# Patient Record
Sex: Female | Born: 1965 | Race: Black or African American | Hispanic: No | Smoking: Former smoker
Health system: Southern US, Community
[De-identification: ages and names within clinical notes are randomized; demographics above are authoritative.]

## PROBLEM LIST (undated history)

## (undated) DIAGNOSIS — L659 Nonscarring hair loss, unspecified: Secondary | ICD-10-CM

## (undated) DIAGNOSIS — F419 Anxiety disorder, unspecified: Secondary | ICD-10-CM

## (undated) DIAGNOSIS — R7303 Prediabetes: Secondary | ICD-10-CM

## (undated) DIAGNOSIS — G4730 Sleep apnea, unspecified: Secondary | ICD-10-CM

## (undated) DIAGNOSIS — I10 Essential (primary) hypertension: Secondary | ICD-10-CM

## (undated) DIAGNOSIS — K219 Gastro-esophageal reflux disease without esophagitis: Secondary | ICD-10-CM

## (undated) DIAGNOSIS — M7120 Synovial cyst of popliteal space [Baker], unspecified knee: Secondary | ICD-10-CM

## (undated) HISTORY — PX: KNEE SURGERY: SHX244

## (undated) HISTORY — PX: HEMORRHOID SURGERY: SHX153

## (undated) HISTORY — PX: ABDOMINAL HYSTERECTOMY: SHX81

## (undated) HISTORY — PX: JOINT REPLACEMENT: SHX530

## (undated) HISTORY — PX: APPENDECTOMY: SHX54

---

## 2002-06-20 ENCOUNTER — Emergency Department (HOSPITAL_COMMUNITY): Admission: EM | Admit: 2002-06-20 | Discharge: 2002-06-20 | Payer: Self-pay | Admitting: Emergency Medicine

## 2002-08-16 ENCOUNTER — Emergency Department (HOSPITAL_COMMUNITY): Admission: EM | Admit: 2002-08-16 | Discharge: 2002-08-17 | Payer: Self-pay | Admitting: Emergency Medicine

## 2003-03-04 ENCOUNTER — Emergency Department (HOSPITAL_COMMUNITY): Admission: EM | Admit: 2003-03-04 | Discharge: 2003-03-04 | Payer: Self-pay | Admitting: *Deleted

## 2003-07-30 ENCOUNTER — Emergency Department (HOSPITAL_COMMUNITY): Admission: EM | Admit: 2003-07-30 | Discharge: 2003-07-30 | Payer: Self-pay | Admitting: Family Medicine

## 2005-02-17 ENCOUNTER — Inpatient Hospital Stay (HOSPITAL_COMMUNITY): Admission: EM | Admit: 2005-02-17 | Discharge: 2005-02-19 | Payer: Self-pay | Admitting: Family Medicine

## 2005-09-22 ENCOUNTER — Other Ambulatory Visit: Admission: RE | Admit: 2005-09-22 | Discharge: 2005-09-22 | Payer: Self-pay | Admitting: Obstetrics and Gynecology

## 2005-09-29 ENCOUNTER — Encounter: Admission: RE | Admit: 2005-09-29 | Discharge: 2005-09-29 | Payer: Self-pay | Admitting: Obstetrics and Gynecology

## 2006-03-26 ENCOUNTER — Ambulatory Visit (HOSPITAL_COMMUNITY): Admission: RE | Admit: 2006-03-26 | Discharge: 2006-03-26 | Payer: Self-pay | Admitting: *Deleted

## 2006-03-30 ENCOUNTER — Encounter: Admission: RE | Admit: 2006-03-30 | Discharge: 2006-06-28 | Payer: Self-pay | Admitting: *Deleted

## 2006-04-10 ENCOUNTER — Ambulatory Visit (HOSPITAL_COMMUNITY): Admission: RE | Admit: 2006-04-10 | Discharge: 2006-04-10 | Payer: Self-pay | Admitting: *Deleted

## 2006-10-01 ENCOUNTER — Encounter: Admission: RE | Admit: 2006-10-01 | Discharge: 2006-10-01 | Payer: Self-pay | Admitting: Family Medicine

## 2007-02-26 ENCOUNTER — Other Ambulatory Visit: Admission: RE | Admit: 2007-02-26 | Discharge: 2007-02-26 | Payer: Self-pay | Admitting: Family Medicine

## 2007-03-26 ENCOUNTER — Emergency Department (HOSPITAL_COMMUNITY): Admission: EM | Admit: 2007-03-26 | Discharge: 2007-03-27 | Payer: Self-pay | Admitting: Emergency Medicine

## 2008-02-01 ENCOUNTER — Encounter: Admission: RE | Admit: 2008-02-01 | Discharge: 2008-02-01 | Payer: Self-pay | Admitting: Family Medicine

## 2008-05-11 ENCOUNTER — Other Ambulatory Visit: Admission: RE | Admit: 2008-05-11 | Discharge: 2008-05-11 | Payer: Self-pay | Admitting: Family Medicine

## 2009-03-28 ENCOUNTER — Encounter: Admission: RE | Admit: 2009-03-28 | Discharge: 2009-03-28 | Payer: Self-pay | Admitting: Family Medicine

## 2010-02-02 ENCOUNTER — Other Ambulatory Visit: Payer: Self-pay | Admitting: Family Medicine

## 2010-02-02 DIAGNOSIS — Z1239 Encounter for other screening for malignant neoplasm of breast: Secondary | ICD-10-CM

## 2010-02-03 ENCOUNTER — Encounter: Payer: Self-pay | Admitting: Family Medicine

## 2010-02-04 ENCOUNTER — Encounter: Payer: Self-pay | Admitting: Family Medicine

## 2010-03-29 ENCOUNTER — Ambulatory Visit: Payer: Self-pay

## 2010-04-01 ENCOUNTER — Ambulatory Visit: Payer: Self-pay

## 2010-04-05 ENCOUNTER — Ambulatory Visit: Payer: Self-pay

## 2010-04-12 ENCOUNTER — Ambulatory Visit
Admission: RE | Admit: 2010-04-12 | Discharge: 2010-04-12 | Disposition: A | Discharge status: Home / Self Care | Payer: Commercial Indemnity | Source: Ambulatory Visit | Attending: Family Medicine | Admitting: Family Medicine

## 2010-04-12 DIAGNOSIS — Z1239 Encounter for other screening for malignant neoplasm of breast: Secondary | ICD-10-CM

## 2010-05-31 NOTE — Discharge Summary (Signed)
[name], [name] NO.:  [id number]   MEDICAL RECORD NO.:  [id number]          PATIENT TYPE:  OBV   LOCATION:  4705                         FACILITY:  [facility]   PHYSICIAN:  Michaelyn Barter, M.D. DATE OF BIRTH:  March 23, 1965   DATE OF ADMISSION:  02/17/2005  DATE OF DISCHARGE:  02/19/2005                                 DISCHARGE SUMMARY   PRIMARY CARE PHYSICIAN:  Della Goo, M.D.   FINAL DIAGNOSES:  1.  Chest pain.  2.  Anxiety.  3.  Hyponatremia.   CONSULTATIONS:   Cardiology for interpretation of stress test.   PROCEDURES:  1.  Nuclear medicine myocardial perfusion exercise stress test which was      completed on February 19, 2005.  2.  Chest x-ray which was completed on February 17, 2005.   HISTORY OF PRESENT ILLNESS:  Erica Odom is a 45 year old female who presented  to the emergency department complaining of retrosternal chest pain that had  lasted for about one hour during the morning of her admission.  She  initially went to [facility] and was given some  nitroglycerin sublingually.  Her chest symptoms immediately improved.  She  transferred to [facility] ER secondary to EKG changes, and was admitted to  the hospital.  The patient also gives a history that two days prior to her  symptoms, she began taking  diet supplements.  She took two tablets on  Saturday and two tablets on Sunday.  Sunday night, she began to feel weak  all over, dizzy, sweaty, and subsequent to that the following morning, she  developed chest pain.   PAST MEDICAL HISTORY:  Please see that dictated by Dr. Lonia Blood.   HOSPITAL COURSE:  #1 -  CHEST PAIN:  Following the patient's presentation to  the emergency department, she underwent a chest x-ray which revealed no  evidence of active cardiopulmonary disease.  She was admitted to the  medicine floor and provided with oxygen and started on Lovenox 105 mg  subcutaneously q.12h.  Her cardiac enzymes  were checked and they were found  to be negative x3, with a troponin-I of 0.01, 0.02, and 0.01.  Multiple  EKG's have been completed.  Each EKG reveals normal sinus rhythm, but there  does appear to be some slight inversion of the T-waves within leads V1, V2,  V3, V4, and V5.  Because of the patient's complaint of chest pain with  resolution following nitroglycerin and her history of tobacco abuse along  with her being morbidly obese, an exercise stress test was ordered and  [facility]iology was consulted to interpret the results.  The patient  underwent a myocardial imaging exercise stress test on February 19, 2005.  The final impression was that there was a small reversible defect within the  mid segment of the anterior wall.  Findings were likely to be due to non-  uniformed soft tissue attenuation.  Myocardial ischemia is less favored;  however, clinical correlation is strongly recommended.  Overall left  ventricular systolic function was normal.  There was also noted to be  increased  radiotracer within the right axillary nodes, again, clinical  correlation was recommended.  Cardiology actually called me following the  results of this to give me the results over the phone, and after discussion  with cardiology, they indicated that,  that they did not believe the  reversible defect was related to any sort of cardiac ischemia, but again  they were not sure what it was, and they recommended that the patient follow  up closely following her discharge from the hospital.  They did not feel  that the questionable results were related to ischemia.   #2 -  ANXIETY:  The patient mentioned that she had an issue regarding being  very anxious.  She was provided with p.r.n. Xanax over the course of her  hospitalization.   #3 -  HYPONATREMIA:  The patient's sodium level was noted to be 133, on  February 18, 2005.  She manifested no other symptoms secondary to the  hyponatremia.   CONDITION ON  DISCHARGE:  Today, the patient denies having any chest pain and  states that she feels pretty good.  She also states that she is ready to be  discharged home.  Actually, yesterday, the patient requested to be  discharged home, stating that her chest pain had resolved.  I wanted to  perform the stress test on the patient prior to sending her home, however.  Today, the decision has been made to discharge the patient home.  She will  be discharged home on the following medications:   DISCHARGE MEDICATIONS:  1.  Lopressor 25 mg one tablet q.8h.  2.  Protonix 40 mg one tablet daily.  3.  Xanax 0.25 mg q.12h. p.r.n.   FOLLOWUP:  The patient will be instructed to follow up with her primary care  physician, Dr. Della Goo, in the next two to four weeks.      Michaelyn Barter, M.D.  Electronically Signed     OR/MEDQ  D:  02/19/2005  T:  02/19/2005  Job:  [id number]   cc:   Della Goo, M.D.  Fax: [phone]

## 2010-05-31 NOTE — H&P (Signed)
NAMERILEIGH, Odom NO.:  [id number]   MEDICAL RECORD NO.:  [id number]          PATIENT TYPE:  EMS   LOCATION:  MAJO                         FACILITY:  [facility]   PHYSICIAN:  Lonia Blood, M.D.       DATE OF BIRTH:  05-25-1965   DATE OF ADMISSION:  02/17/2005  DATE OF DISCHARGE:                                HISTORY & PHYSICAL   PRIMARY CARE PHYSICIAN:  Della Goo, M.D.   CHIEF COMPLAINT:  Chest pain.   HISTORY OF PRESENT ILLNESS:  [name] is a 45 year old African-American  woman with history of anxiety and depression who presents to [facility]  Emergency Room after she had an episode of retrosternal chest pain lasting  for about 1 hours the morning of admission.  She went to the [facility], and she received some nitroglycerin sublingually, and  immediately her chest pain got better.  She was transferred here to [facility] Emergency Room because her EKG had changes to be admitted to the  hospital.  [name] reports that about 2 days ago she started a new diet  pill called Phentermine, and she took 2 tablets on Saturday and 2 tablets on  Sunday.  Starting Sunday night, she became kind of weak all over, dizzy,  sweating, and just not feeling too good.  She was worried about what would  happen and in the morning started to develop chest pain.  That made her come  to the emergency room.   PAST MEDICAL HISTORY:  1.  Depression.  2.  Anxiety.  3.  Obesity.  4.  Appendectomy.  5.  Hemorrhoids.  6.  Bilateral tubal ligation.  7.  Gastroesophageal reflux disease.   HOME MEDICATIONS:  Include Hydrochlorothiazide and Phentermine.   FAMILY HISTORY:  The patient's mother is alive at age 99 with diabetes and  hypertension.  Father passed away, and he did not have coronary artery  disease.  The patient's grandmother had coronary artery bypass grafting. The  patient has 9 siblings; none of them have early coronary artery disease.   DRUG  ALLERGIES:  '  The patient is allergic to TYLOX and ERYTHROMYCIN that caused some itching.   SOCIAL HISTORY:  The patient smokes 1/2 pack of cigarettes a day.  She is  married.  She lives with her husband and her daughter, and she works as a  Occupational psychologist.   REVIEW OF SYSTEMS:  Positive for nausea and presyncope. All other systems  are negative.   PHYSICAL EXAMINATION:  VITAL SIGNS: On admission, temperature is 98.6, pulse  108, respirations 18, blood pressure 135/96, O2 saturation 100% on 2 liters  oxygen.  GENERAL APPEARANCE:  The patient is well-developed, well-nourished, in no  acute distress, alert and oriented to place, person, and time.  She is  tearful and extremely anxious.  HEENT:  Her head is normocephalic and atraumatic.  Pupils equal, round, and  reactive to light and accommodation.  Extraocular movements intact.  Throat  is clear.  NECK:  Supple without JVD.  HEART: Regular rate and rhythm without  murmurs, rubs, or gallops.  ABDOMEN: Soft, nontender, nondistended.  Bowel sounds are present.  EXTREMITIES:  No edema.  NEUROLOGIC: Nonfocal.  Cranial nerves II-XII are intact.  She has 5/5  strength in all four extremities.  SKIN:  Warm and dry without suspicious rashes.  PSYCHIATRIC: She appears very depressed and anxious.   LABORATORY DATA:  At time of admission, white blood cell count 7.2,  hemoglobin 13.4, platelet count 400.  Two sets of CK, CK-MB, and troponin I  were within normal limits.   EKG shows normal sinus rhythm with flat T waves later on evolving to normal  sinus rhythm with negative T waves in V1 to V4.   Portable chest x-ray shows no acute disease.   Sodium 135, potassium 4, chloride 99, bicarb 26, BUN 12, creatinine 0.9,  glucose 112.   IMPRESSION:  1.  Chest pain, most likely secondary to phentermine use, also possibility      of a panic attack cannot be excluded.  I highly doubt this is an acute      coronary syndrome.  The  patient has some risk factors for coronary      artery disease including the fact that she smokes cigarettes and she is      obese, but I think that most likely she has significant problem due to      the use of the phentermine and tachycardia as effect of the medication.      The patient will be admitted to [facility].  She will be      observed on telemetry.  Three sets of cardiac enzymes will be obtained,      and she will have possible cardiology consultation for stress test      evaluation.  2.  Tobacco abuse.  The patient will be counseled.  She is refusing.  She      does not need nicotine patch for now.  3.  Anxiety.  Will be using alprazolam as needed for patient's anxiety.  4.  Obesity. The patient will continue her diet and consult.      Lonia Blood, M.D.  Electronically Signed     SL/MEDQ  D:  02/17/2005  T:  02/17/2005  Job:  [id number]   cc:   Della Goo, M.D.  Fax: [phone]

## 2010-08-02 ENCOUNTER — Other Ambulatory Visit: Payer: Self-pay | Admitting: Sports Medicine

## 2010-08-02 DIAGNOSIS — M25562 Pain in left knee: Secondary | ICD-10-CM

## 2010-08-23 ENCOUNTER — Other Ambulatory Visit: Payer: Commercial Indemnity

## 2010-10-07 LAB — COMPREHENSIVE METABOLIC PANEL
Alkaline Phosphatase: 63
BUN: 14
Calcium: 9.5
Total Protein: 7.9

## 2010-10-07 LAB — URINALYSIS, ROUTINE W REFLEX MICROSCOPIC
Ketones, ur: NEGATIVE
Nitrite: NEGATIVE
Protein, ur: NEGATIVE
Urobilinogen, UA: 0.2
pH: 7.5

## 2010-10-07 LAB — CBC
HCT: 35.9 — ABNORMAL LOW
MCV: 81.4
RBC: 4.4

## 2010-10-07 LAB — URINE CULTURE: Colony Count: 45000

## 2010-10-07 LAB — DIFFERENTIAL
Lymphocytes Relative: 18
Monocytes Relative: 6
Neutrophils Relative %: 76

## 2010-10-23 ENCOUNTER — Emergency Department (HOSPITAL_COMMUNITY): Payer: Commercial Indemnity

## 2010-10-23 ENCOUNTER — Emergency Department (HOSPITAL_COMMUNITY)
Admission: EM | Admit: 2010-10-23 | Discharge: 2010-10-24 | Disposition: A | Discharge status: Home / Self Care | Payer: Commercial Indemnity | Attending: Emergency Medicine | Admitting: Emergency Medicine

## 2010-10-23 DIAGNOSIS — F411 Generalized anxiety disorder: Secondary | ICD-10-CM | POA: Insufficient documentation

## 2010-10-23 DIAGNOSIS — S91309A Unspecified open wound, unspecified foot, initial encounter: Secondary | ICD-10-CM | POA: Insufficient documentation

## 2010-10-23 DIAGNOSIS — IMO0002 Reserved for concepts with insufficient information to code with codable children: Secondary | ICD-10-CM | POA: Insufficient documentation

## 2010-10-23 DIAGNOSIS — W268XXA Contact with other sharp object(s), not elsewhere classified, initial encounter: Secondary | ICD-10-CM | POA: Insufficient documentation

## 2010-10-24 ENCOUNTER — Emergency Department (HOSPITAL_COMMUNITY): Payer: Commercial Indemnity

## 2011-07-03 ENCOUNTER — Other Ambulatory Visit: Payer: Self-pay | Admitting: Family Medicine

## 2011-07-03 DIAGNOSIS — Z1231 Encounter for screening mammogram for malignant neoplasm of breast: Secondary | ICD-10-CM

## 2011-07-16 ENCOUNTER — Ambulatory Visit: Payer: Commercial Indemnity

## 2011-08-01 ENCOUNTER — Other Ambulatory Visit: Payer: Self-pay | Admitting: Sports Medicine

## 2011-08-01 DIAGNOSIS — M25562 Pain in left knee: Secondary | ICD-10-CM

## 2011-08-08 ENCOUNTER — Ambulatory Visit
Admission: RE | Admit: 2011-08-08 | Discharge: 2011-08-08 | Disposition: A | Discharge status: Home / Self Care | Payer: 59 | Source: Ambulatory Visit | Attending: Sports Medicine | Admitting: Sports Medicine

## 2011-08-08 DIAGNOSIS — M25562 Pain in left knee: Secondary | ICD-10-CM

## 2012-02-12 ENCOUNTER — Other Ambulatory Visit (HOSPITAL_COMMUNITY)
Admission: RE | Admit: 2012-02-12 | Discharge: 2012-02-12 | Disposition: A | Discharge status: Home / Self Care | Payer: BC Managed Care – PPO | Source: Ambulatory Visit | Attending: Family Medicine | Admitting: Family Medicine

## 2012-02-12 ENCOUNTER — Ambulatory Visit
Admission: RE | Admit: 2012-02-12 | Discharge: 2012-02-12 | Disposition: A | Discharge status: Home / Self Care | Payer: BC Managed Care – PPO | Source: Ambulatory Visit | Attending: Family Medicine | Admitting: Family Medicine

## 2012-02-12 ENCOUNTER — Other Ambulatory Visit: Payer: Self-pay | Admitting: Family Medicine

## 2012-02-12 DIAGNOSIS — Z1231 Encounter for screening mammogram for malignant neoplasm of breast: Secondary | ICD-10-CM

## 2012-02-12 DIAGNOSIS — Z01419 Encounter for gynecological examination (general) (routine) without abnormal findings: Secondary | ICD-10-CM | POA: Insufficient documentation

## 2012-12-17 ENCOUNTER — Other Ambulatory Visit: Payer: Self-pay

## 2012-12-17 DIAGNOSIS — Z1231 Encounter for screening mammogram for malignant neoplasm of breast: Secondary | ICD-10-CM

## 2013-02-14 ENCOUNTER — Ambulatory Visit: Payer: BC Managed Care – PPO

## 2013-10-17 ENCOUNTER — Emergency Department (HOSPITAL_BASED_OUTPATIENT_CLINIC_OR_DEPARTMENT_OTHER)
Admission: EM | Admit: 2013-10-17 | Discharge: 2013-10-17 | Disposition: A | Discharge status: Home / Self Care | Payer: Worker's Compensation | Attending: Emergency Medicine | Admitting: Emergency Medicine

## 2013-10-17 ENCOUNTER — Emergency Department (HOSPITAL_BASED_OUTPATIENT_CLINIC_OR_DEPARTMENT_OTHER): Payer: Worker's Compensation

## 2013-10-17 ENCOUNTER — Encounter (HOSPITAL_BASED_OUTPATIENT_CLINIC_OR_DEPARTMENT_OTHER): Payer: Self-pay | Admitting: Emergency Medicine

## 2013-10-17 DIAGNOSIS — Z8719 Personal history of other diseases of the digestive system: Secondary | ICD-10-CM | POA: Diagnosis not present

## 2013-10-17 DIAGNOSIS — M25562 Pain in left knee: Secondary | ICD-10-CM | POA: Diagnosis present

## 2013-10-17 HISTORY — DX: Gastro-esophageal reflux disease without esophagitis: K21.9

## 2013-10-17 HISTORY — DX: Synovial cyst of popliteal space (Baker), unspecified knee: M71.20

## 2013-10-17 NOTE — ED Notes (Signed)
Vitals: 138/92, 88 R, 18, 100% RA

## 2013-10-17 NOTE — ED Notes (Signed)
Pt was walking when left knee locked up, pt sat down at that time. Reports numbness and tingling to left foot. Pt brought in by [facility]. (+)pulses (+)edema to lower leg

## 2013-10-17 NOTE — ED Notes (Signed)
Patient transported to X-ray 

## 2013-10-17 NOTE — Discharge Instructions (Signed)
As discussed, your knee pain is likely due to disruption of the meniscus.  Your XR today is reassuring.  Please be sure to follow-up with your orthopedist.  In the interim, please use Tylenol, ice and crutches for comfort.  Return here for any concerning changes in your condition.

## 2013-10-17 NOTE — ED Notes (Signed)
Pt reports she was walking down stairs today and her knee "gave". States "felt like it wasn't attached to my leg". Reports has had issues with the knee before but no specific injury. States pain is now radiating from knee to thigh and shin.

## 2013-10-17 NOTE — ED Provider Notes (Signed)
CSN: [id number]     Arrival date & time 10/17/13  1252 History  This chart was scribed for Gerhard Munchobert Zoeie Ritter, MD by Richarda Overlieichard Holland, ED Scribe. This patient was seen in room MH12/MH12 and the patient's care was started 3:15 PM.    Chief Complaint  Patient presents with  . Knee Pain    The history is provided by the patient. No language interpreter was used.   HPI Comments: Erica Odom is a 48 y.o. female with a history of Baker's cyst of the knee who presents to the Emergency Department complaining of left knee pain that occurred PTA when patient was walking down a flight of stairs and states her left knee gave out. She reports that her pain is currently improving. She reports that she did not fall and denies any previous trauma.  She reports associated swelling to her left knee. The patient reports she has no history of orthopedic procedures of her ankles, knees or hips. She states that she has had left knee problems for the past couple years but denies any similar, previous episodes. She reports that she tore a ligament in her left ankle a long time ago.  She has also received multiple cortisone injections into the affected knee. No distal dysesthesia or weakness.   Past Medical History  Diagnosis Date  . GERD (gastroesophageal reflux disease)   . Baker's cyst of knee    No past surgical history on file. No family history on file. History  Substance Use Topics  . Smoking status: Not on file  . Smokeless tobacco: Not on file  . Alcohol Use: Not on file   OB History   Grav Para Term Preterm Abortions TAB SAB Ect Mult Living                 Review of Systems  Constitutional:       Per HPI, otherwise negative  HENT:       Per HPI, otherwise negative  Respiratory:       Per HPI, otherwise negative  Cardiovascular:       Per HPI, otherwise negative  Gastrointestinal: Negative for vomiting.  Endocrine:       Negative aside from HPI  Genitourinary:       Neg aside from  HPI   Musculoskeletal: Positive for arthralgias and joint swelling.       Per HPI, otherwise negative  Skin: Negative.   Neurological: Negative for syncope.      Allergies  Erythromycin  Home Medications   Prior to Admission medications   Not on File   BP 128/72  Pulse 80  Temp(Src) 98.7 F (37.1 C) (Oral)  Resp 18  Ht 5\' 2"  (1.575 m)  Wt 261 lb (118.389 kg)  BMI 47.73 kg/m2  SpO2 99% Physical Exam  Nursing note and vitals reviewed. Constitutional: She is oriented to person, place, and time. She appears well-developed and well-nourished. No distress.  HENT:  Head: Normocephalic and atraumatic.  Eyes: Conjunctivae and EOM are normal.  Cardiovascular: Normal rate, regular rhythm and normal heart sounds.   Pulmonary/Chest: Effort normal and breath sounds normal. No stridor. No respiratory distress.  Abdominal: She exhibits no distension.  Musculoskeletal: She exhibits no edema.  Left knee ROM 160-180, preserved quadriceps and hamstring function  Right knee unremarkable   Neurological: She is alert and oriented to person, place, and time. No cranial nerve deficit.  Skin: Skin is warm and dry.  Psychiatric: She has a normal mood and affect.  ED Course  Procedures DIAGNOSTIC STUDIES: Oxygen Saturation is 99% on RA, normal by my interpretation.    COORDINATION OF CARE: 3:20 PM Discussed treatment plan with pt at bedside and pt agreed to plan.   Labs Review Labs Reviewed - No data to display  Imaging Review Dg Knee 1-2 Views Left  10/17/2013   CLINICAL DATA:  Pain.  EXAM: LEFT KNEE - 1-2 VIEW  COMPARISON:  03/29/2010.  FINDINGS: No definite joint effusion. No fracture. No appreciable degenerative change.  IMPRESSION: No findings to explain the patient's pain.   Electronically Signed   By: Leanna Battles M.D.   On: 10/17/2013 14:18    On re-exam she was in no distress.  We discussed meniscus injury versus rupture of cyst. She received immobilizer, crutches and  will f/u w her orthopedist.  MDM   Final diagnoses:  Knee pain, acute, left   I personally performed the services described in this documentation, which was scribed in my presence. The recorded information has been reviewed and is accurate.  Patient presents after her knee gave out with increasing pain. No e/o vascular or neurologic compromise. Patient has ortho f/u. D/C in stable condition with return precautions.     Gerhard Munch, MD 10/17/13 1620

## 2013-10-24 ENCOUNTER — Ambulatory Visit
Admission: RE | Admit: 2013-10-24 | Discharge: 2013-10-24 | Disposition: A | Discharge status: Home / Self Care | Payer: BC Managed Care – PPO | Source: Ambulatory Visit

## 2013-10-24 DIAGNOSIS — Z1231 Encounter for screening mammogram for malignant neoplasm of breast: Secondary | ICD-10-CM

## 2016-11-18 ENCOUNTER — Emergency Department (HOSPITAL_COMMUNITY): Payer: BC Managed Care – PPO

## 2016-11-18 ENCOUNTER — Encounter (HOSPITAL_COMMUNITY): Payer: Self-pay | Admitting: Emergency Medicine

## 2016-11-18 DIAGNOSIS — S92351A Displaced fracture of fifth metatarsal bone, right foot, initial encounter for closed fracture: Secondary | ICD-10-CM | POA: Diagnosis not present

## 2016-11-18 DIAGNOSIS — S6391XA Sprain of unspecified part of right wrist and hand, initial encounter: Secondary | ICD-10-CM | POA: Insufficient documentation

## 2016-11-18 DIAGNOSIS — W109XXA Fall (on) (from) unspecified stairs and steps, initial encounter: Secondary | ICD-10-CM | POA: Diagnosis not present

## 2016-11-18 DIAGNOSIS — Y9301 Activity, walking, marching and hiking: Secondary | ICD-10-CM | POA: Insufficient documentation

## 2016-11-18 DIAGNOSIS — Y990 Civilian activity done for income or pay: Secondary | ICD-10-CM | POA: Insufficient documentation

## 2016-11-18 DIAGNOSIS — Y9289 Other specified places as the place of occurrence of the external cause: Secondary | ICD-10-CM | POA: Insufficient documentation

## 2016-11-18 DIAGNOSIS — T07XXXA Unspecified multiple injuries, initial encounter: Secondary | ICD-10-CM | POA: Diagnosis present

## 2016-11-18 NOTE — ED Triage Notes (Signed)
Vitals HR 74 NSR, 99% RA, RR 16, BP 174/94 in route. Pt states she does not take any medications per EMS.

## 2016-11-18 NOTE — ED Triage Notes (Signed)
Pt comes from work via EMS after a mechanical fall down 3 steps.  Pt complaints of right wrist and right ankle pain.  No deformity noted but some soft tissue swelling noted to right wrist.  Pt does report hitting head but no notable injury sustained.  Denies blood thinner use.  Pt has 20 G in right AC and was given 100 mcg of fentanyl in route.  Pt slightly drowsy during triage.

## 2016-11-19 ENCOUNTER — Emergency Department (HOSPITAL_COMMUNITY)
Admission: EM | Admit: 2016-11-19 | Discharge: 2016-11-19 | Disposition: A | Discharge status: Home / Self Care | Payer: BC Managed Care – PPO | Attending: Emergency Medicine | Admitting: Emergency Medicine

## 2016-11-19 DIAGNOSIS — S92351A Displaced fracture of fifth metatarsal bone, right foot, initial encounter for closed fracture: Secondary | ICD-10-CM

## 2016-11-19 DIAGNOSIS — S6391XA Sprain of unspecified part of right wrist and hand, initial encounter: Secondary | ICD-10-CM

## 2016-11-19 MED ORDER — HYDROCODONE-ACETAMINOPHEN 5-325 MG PO TABS
2.0000 | ORAL_TABLET | Freq: Once | ORAL | Status: AC
  Administered 2016-11-19: 2 via ORAL
  Filled 2016-11-19: qty 2

## 2016-11-19 MED ORDER — HYDROCODONE-ACETAMINOPHEN 5-325 MG PO TABS: 1.0000 | ORAL_TABLET | ORAL | 0 refills | Status: DC | PRN

## 2016-11-19 NOTE — ED Provider Notes (Signed)
[facility]-EMERGENCY DEPT Provider Note   CSN: [id number] Arrival date & time: 11/18/16  2125     History   Chief Complaint Chief Complaint  Patient presents with  . Fall  . Foot Pain  . Wrist Pain    HPI Erica Odom is a 51 y.o. female.  Patient without significant medical history presents after fall earlier this evening. She reports she was walking down a flight of carpeted stairs, mis-stepped and fell on the last 2-4 steps. She does not remember how she landed but complains of significant right foot and hand pain. No back/chest/abdominal/neck pain. She has not attempted to ambulate since the fall. No LOC, headache, dizziness.      Past Medical History:  Diagnosis Date  . Baker's cyst of knee   . GERD (gastroesophageal reflux disease)     There are no active problems to display for this patient.   History reviewed. No pertinent surgical history.  OB History    No data available       Home Medications    Prior to Admission medications   Not on File    Family History No family history on file.  Social History Social History   Tobacco Use  . Smoking status: Never Smoker  . Smokeless tobacco: Never Used  Substance Use Topics  . Alcohol use: No    Frequency: Never  . Drug use: No     Allergies   Erythromycin   Review of Systems Review of Systems  Constitutional: Negative for diaphoresis.  Respiratory: Negative.   Cardiovascular: Negative.   Gastrointestinal: Negative.   Musculoskeletal:       See HPI.  Skin: Negative.  Negative for color change and wound.  Neurological: Negative.  Negative for syncope and headaches.     Physical Exam Updated Vital Signs BP (!) 154/97 (BP Location: Left Arm)   Pulse 90   Resp 12   SpO2 100%   Physical Exam  Constitutional: She is oriented to person, place, and time. She appears well-developed and well-nourished.  HENT:  Head: Normocephalic and atraumatic.  Eyes:  Pupils are equal, round, and reactive to light.  Neck: Normal range of motion.  Pulmonary/Chest: Effort normal. She exhibits no tenderness.  Abdominal: There is no tenderness.  Musculoskeletal:  Right foot moderately swollen without bony deformity or discoloration. Tender laterally. Pain with movement of digits. Cap RF <2s. Ankle non-tender. Achilles intact. No calf or knee tenderness. No pelvic pain to palpation. No midline cervical or other spinal tenderness. Right hand swollen over thenar surface. No bony deformity or discoloration. Cap RF <2s.   Neurological: She is alert and oriented to person, place, and time.  Skin: Skin is warm and dry.     ED Treatments / Results  Labs (all labs ordered are listed, but only abnormal results are displayed) Labs Reviewed - No data to display  EKG  EKG Interpretation None       Radiology Dg Wrist Complete Right  Result Date: 11/18/2016 CLINICAL DATA:  51 y/o  F; pain at the base of thumb after fall. EXAM: RIGHT WRIST - COMPLETE 3+ VIEW COMPARISON:  None. FINDINGS: No acute fracture identified. The trapezoid bone overlaps with the scaphoid bone on multiple views. Correlate for focal tenderness. Normal radiocarpal alignment. IMPRESSION: 1. No acute fracture identified. 2. Trapezoid bone overlap the scaphoid bone on multiple views which may represent trapezoid dislocation versus artifact of projection. Clinical correlation recommended. Electronically Signed   By: Micah NoelLance  Furusawa-Stratton M.D.   On: 11/18/2016 22:19   Dg Ankle Complete Right  Result Date: 11/18/2016 CLINICAL DATA:  Patient slipped and fell down several steps at work today. Lateral ankle pain. EXAM: RIGHT ANKLE - COMPLETE 3+ VIEW COMPARISON:  Right foot radiographs from 10/24/2010 FINDINGS: The ankle mortise is congruent. There is soft tissue swelling about the malleoli. There is an acute transverse fracture at the base of the fifth metatarsa extending into the fifth TMT joint with 3  mm of distraction of fracture fragments. Calcaneal enthesophytes are present along the plantar and dorsal aspect. IMPRESSION: 1. Acute intra-articular fracture involving the base of the fifth metatarsal with 3 mm of distraction of fracture fragments. 2. Soft tissue swelling about the malleoli. Electronically Signed   By: Tollie Ethavid  Kwon M.D.   On: 11/18/2016 22:16    Procedures Procedures (including critical care time)  Medications Ordered in ED Medications  HYDROcodone-acetaminophen (NORCO/VICODIN) 5-325 MG per tablet 2 tablet (not administered)     Initial Impression / Assessment and Plan / ED Course  I have reviewed the triage vital signs and the nursing notes.  Pertinent labs & imaging results that were available during my care of the patient were reviewed by me and considered in my medical decision making (see chart for details).     Patient here for evaluation after fall at work. She has a right 5th base MT fracture and right hand sprain, making crutch use an unavailable option.   Images reviewed with Dr. Rhunette CroftNanavati. CAM walker boot provided. Velcro thumb spica splint provided. Will refer to orthopedics for follow up care. Will provide Rx for rolling LE support.   Final Clinical Impressions(s) / ED Diagnoses   Final diagnoses:  None   1. Right 5th base metatarsal fracture 2. Right hand sprain 3. Fall  ED Discharge Orders    None       Elpidio AnisUpstill, Rmoni Keplinger, PA-C 11/19/16 [id number]    Derwood KaplanNanavati, Ankit, MD 11/20/16 [phone]

## 2016-11-19 NOTE — ED Notes (Signed)
Pt refused ice for extremities. When asked about pain from 1-10 she stated she was at a 50.

## 2017-01-19 ENCOUNTER — Other Ambulatory Visit: Payer: Self-pay | Admitting: Family Medicine

## 2017-01-19 DIAGNOSIS — Z1231 Encounter for screening mammogram for malignant neoplasm of breast: Secondary | ICD-10-CM

## 2017-02-06 ENCOUNTER — Ambulatory Visit: Payer: Self-pay

## 2017-04-10 ENCOUNTER — Other Ambulatory Visit: Payer: Self-pay | Admitting: Sports Medicine

## 2017-04-10 DIAGNOSIS — S92351D Displaced fracture of fifth metatarsal bone, right foot, subsequent encounter for fracture with routine healing: Secondary | ICD-10-CM

## 2017-11-22 ENCOUNTER — Emergency Department (HOSPITAL_COMMUNITY)
Admission: EM | Admit: 2017-11-22 | Discharge: 2017-11-22 | Disposition: A | Discharge status: Home / Self Care | Payer: BC Managed Care – PPO | Attending: Emergency Medicine | Admitting: Emergency Medicine

## 2017-11-22 ENCOUNTER — Other Ambulatory Visit: Payer: Self-pay

## 2017-11-22 ENCOUNTER — Emergency Department (HOSPITAL_COMMUNITY): Payer: BC Managed Care – PPO

## 2017-11-22 ENCOUNTER — Encounter (HOSPITAL_COMMUNITY): Payer: Self-pay

## 2017-11-22 DIAGNOSIS — K219 Gastro-esophageal reflux disease without esophagitis: Secondary | ICD-10-CM | POA: Diagnosis not present

## 2017-11-22 DIAGNOSIS — R51 Headache: Secondary | ICD-10-CM | POA: Insufficient documentation

## 2017-11-22 DIAGNOSIS — R519 Headache, unspecified: Secondary | ICD-10-CM

## 2017-11-22 DIAGNOSIS — R42 Dizziness and giddiness: Secondary | ICD-10-CM | POA: Insufficient documentation

## 2017-11-22 HISTORY — DX: Sleep apnea, unspecified: G47.30

## 2017-11-22 HISTORY — DX: Prediabetes: R73.03

## 2017-11-22 LAB — CBC
HCT: 38.2 % (ref 36.0–46.0)
Hemoglobin: 12 g/dL (ref 12.0–15.0)
MCH: 25.6 pg — AB (ref 26.0–34.0)
MCHC: 31.4 g/dL (ref 30.0–36.0)
MCV: 81.4 fL (ref 80.0–100.0)
NRBC: 0 % (ref 0.0–0.2)
Platelets: 313 10*3/uL (ref 150–400)
RBC: 4.69 MIL/uL (ref 3.87–5.11)
RDW: 16.1 % — ABNORMAL HIGH (ref 11.5–15.5)
WBC: 5.5 10*3/uL (ref 4.0–10.5)

## 2017-11-22 LAB — URINALYSIS, ROUTINE W REFLEX MICROSCOPIC
Bilirubin Urine: NEGATIVE
Glucose, UA: NEGATIVE mg/dL
HGB URINE DIPSTICK: NEGATIVE
Ketones, ur: NEGATIVE mg/dL
Leukocytes, UA: NEGATIVE
Nitrite: NEGATIVE
PH: 6 (ref 5.0–8.0)
Protein, ur: NEGATIVE mg/dL
SPECIFIC GRAVITY, URINE: 1.016 (ref 1.005–1.030)

## 2017-11-22 LAB — BASIC METABOLIC PANEL
ANION GAP: 8 (ref 5–15)
BUN: 17 mg/dL (ref 6–20)
CALCIUM: 9.1 mg/dL (ref 8.9–10.3)
CO2: 27 mmol/L (ref 22–32)
Chloride: 103 mmol/L (ref 98–111)
Creatinine, Ser: 0.92 mg/dL (ref 0.44–1.00)
Glucose, Bld: 108 mg/dL — ABNORMAL HIGH (ref 70–99)
Potassium: 3.9 mmol/L (ref 3.5–5.1)
Sodium: 138 mmol/L (ref 135–145)

## 2017-11-22 LAB — I-STAT BETA HCG BLOOD, ED (MC, WL, AP ONLY): I-stat hCG, quantitative: <5 m[IU]/mL (ref <5)

## 2017-11-22 LAB — CBG MONITORING, ED: Glucose-Capillary: 88 mg/dL (ref 70–99)

## 2017-11-22 MED ORDER — PROCHLORPERAZINE EDISYLATE 10 MG/2ML IJ SOLN
10.0000 mg | Freq: Once | INTRAMUSCULAR | Status: AC
  Administered 2017-11-22: 10 mg via INTRAVENOUS
  Filled 2017-11-22: qty 2

## 2017-11-22 MED ORDER — DIPHENHYDRAMINE HCL 50 MG/ML IJ SOLN
25.0000 mg | Freq: Once | INTRAMUSCULAR | Status: AC
  Administered 2017-11-22: 25 mg via INTRAVENOUS
  Filled 2017-11-22: qty 1

## 2017-11-22 MED ORDER — MECLIZINE HCL 25 MG PO TABS: 25.0000 mg | ORAL_TABLET | Freq: Three times a day (TID) | ORAL | 0 refills | Status: DC | PRN

## 2017-11-22 MED ORDER — SODIUM CHLORIDE 0.9 % IV BOLUS
500.0000 mL | Freq: Once | INTRAVENOUS | Status: AC
  Administered 2017-11-22: 500 mL via INTRAVENOUS

## 2017-11-22 MED ORDER — KETOROLAC TROMETHAMINE 30 MG/ML IJ SOLN
30.0000 mg | Freq: Once | INTRAMUSCULAR | Status: AC
  Administered 2017-11-22: 30 mg via INTRAVENOUS
  Filled 2017-11-22: qty 1

## 2017-11-22 NOTE — ED Triage Notes (Signed)
Pt states that she has had a headache for several days. Pt states that she has had some dizziness with it as well.  Pt also states that while she is not dx with hypertension, she has been having BP issues lately.

## 2017-11-22 NOTE — ED Notes (Signed)
Patient transported to CT 

## 2017-11-22 NOTE — ED Provider Notes (Signed)
[facility]-EMERGENCY DEPT Provider Note   CSN: [id number] Arrival date & time: 11/22/17  1326     History   Chief Complaint Chief Complaint  Patient presents with  . Headache  . Dizziness    HPI Erica Odom is a 52 y.o. female with a history of OSA on BiPAP, GERD, and prediabetes who presents to the emergency department with a chief complaint of headache.  The patient endorses a constant, all over headache that she did characterizes as achy that began 7 days ago.  She reports a history of mild headaches in the past that would improve with BC powder.  She feels her headache is worse when she is performing tasks that require more concentration.  No known alleviating factors.  She also reports her previous headaches were worse when she was under an increased amount of stress at work and would improve when stress resolved.  She reports she has been under more stress for the last few weeks, but reports The headache is not improved like previous headaches when she would get home from her daytime job before going to her evening job.  She reports yesterday the headache was so severe that she could barely lift her head off of the pillow all day and did not even get out of bed or get dressed.  She reports she has also developed intermittent episodes of dizziness, that she describes as spinning that has been coming and going since yesterday.  Reports the episode will last for a few minutes before resolving.  No known aggravating or alleviating factors.  She states the symptoms do not start or stop whenever she stands.  She reports that 2 weeks ago that she had a pulsatile tinnitus in her right ear that lasted for several days before resolving.  She also reports that she is put on about 7 pounds despite starting a new weight loss plan with healthy eating over the last month.  She reports that she has been checking her blood pressures frequently at home and they have been  ranging anywhere from the 130s over 80s to the 160s over 100s.  She does not currently take any blood pressure medication as her primary care provider is been watching it.  She denies fevers, chills, otalgia, URI symptoms, chest pain, lightheadedness, neck pain or stiffness, nausea, vomiting, abdominal pain, sore throat, weakness, numbness, facial droop, or slurred speech.  She is a never smoker.  She denies IV recreational drug use.  She denies alcohol use.   The history is provided by the patient.  No language interpreter was used.    Past Medical History:  Diagnosis Date  . Baker's cyst of knee   . GERD (gastroesophageal reflux disease)   . Prediabetes   . Sleep apnea     There are no active problems to display for this patient.   Past Surgical History:  Procedure Laterality Date  . ABDOMINAL HYSTERECTOMY    . APPENDECTOMY    . JOINT REPLACEMENT       OB History   None      Home Medications    Prior to Admission medications   Medication Sig Start Date End Date Taking? Authorizing Provider  ibuprofen (ADVIL,MOTRIN) 200 MG tablet Take 800 mg by mouth every 6 (six) hours as needed for moderate pain.   Yes [provider]  meclizine (ANTIVERT) 25 MG tablet Take 1 tablet (25 mg total) by mouth 3 (three) times daily as needed for dizziness. 11/22/17  Ruvi Fullenwider A, PA-C    Family History No family history on file.  Social History Social History   Tobacco Use  . Smoking status: Never Smoker  . Smokeless tobacco: Never Used  Substance Use Topics  . Alcohol use: Yes    Frequency: Never    Comment: occasionally  . Drug use: No     Allergies   Oxycodone-acetaminophen; Tyloxapol; and Erythromycin   Review of Systems Review of Systems  Constitutional: Negative for activity change, chills and fever.  HENT: Positive for tinnitus (Pulsatile; resolved). Negative for congestion.   Respiratory: Negative for shortness of breath.   Cardiovascular: Negative  for chest pain.  Gastrointestinal: Negative for abdominal pain, diarrhea, nausea and vomiting.  Genitourinary: Negative for dysuria, frequency and urgency.  Musculoskeletal: Negative for arthralgias, back pain, myalgias, neck pain and neck stiffness.  Skin: Negative for color change, rash and wound.  Allergic/Immunologic: Negative for immunocompromised state.  Neurological: Positive for dizziness and headaches. Negative for seizures, syncope, weakness and numbness.  Psychiatric/Behavioral: Negative for confusion.     Physical Exam Updated Vital Signs BP (!) 122/54 (BP Location: Left Arm)   Pulse 70   Temp 98.1 F (36.7 C) (Oral)   Resp 16   Wt 121.7 kg   SpO2 99%   BMI 49.09 kg/m   Physical Exam  Constitutional: No distress.  HENT:  Head: Normocephalic.  Right Ear: External ear normal.  Left Ear: External ear normal.  Nose: Nose normal.  Mouth/Throat: Oropharynx is clear and moist.  Eyes: Pupils are equal, round, and reactive to light. Conjunctivae and EOM are normal. Right eye exhibits no nystagmus. Left eye exhibits no nystagmus.  Neck: Normal range of motion. Neck supple.  No meningismus  Cardiovascular: Normal rate, regular rhythm, normal heart sounds and intact distal pulses. Exam reveals no gallop and no friction rub.  No murmur heard. Pulmonary/Chest: Effort normal. No stridor. No respiratory distress. She has no wheezes. She has no rales. She exhibits no tenderness.  Abdominal: Soft. She exhibits no distension and no mass. There is no tenderness. There is no rebound and no guarding. No hernia.  Neurological: She is alert.  Alert and oriented x4.  Cranial nerves II through XII are grossly intact.  5 out of 5 strength against resistance of the bilateral upper and lower extremities.  Sensation is intact and equal throughout the bilateral upper and lower extremity's.  No cogwheeling or clonus bilaterally.  Normal rapid alternating movements.  Finger-to-nose is intact  bilaterally without dysmetria.  Negative Romberg.  No pronator drift.  Symmetric tandem gait.  Speaks in complete, fluent sentences.  Skin: Skin is warm. No rash noted.  Psychiatric: Her behavior is normal.  Nursing note and vitals reviewed.    ED Treatments / Results  Labs (all labs ordered are listed, but only abnormal results are displayed) Labs Reviewed  BASIC METABOLIC PANEL - Abnormal; Notable for the following components:      Result Value   Glucose, Bld 108 (*)    All other components within normal limits  CBC - Abnormal; Notable for the following components:   MCH 25.6 (*)    RDW 16.1 (*)    All other components within normal limits  URINALYSIS, ROUTINE W REFLEX MICROSCOPIC  I-STAT BETA HCG BLOOD, ED ([facility], [facility], [facility] ONLY)  CBG MONITORING, ED    EKG None  Radiology Ct Head Wo Contrast  Result Date: 11/22/2017 CLINICAL DATA:  52 y/o  F; 1 week of migraine with some dizziness.  EXAM: CT HEAD WITHOUT CONTRAST TECHNIQUE: Contiguous axial images were obtained from the base of the skull through the vertex without intravenous contrast. COMPARISON:  03/04/2003 CT head. FINDINGS: Brain: No evidence of acute infarction, hemorrhage, hydrocephalus, extra-axial collection or mass lesion/mass effect. Vascular: No hyperdense vessel or unexpected calcification. Skull: Normal. Negative for fracture or focal lesion. Sinuses/Orbits: No acute finding. Other: None. IMPRESSION: No acute intracranial abnormality identified. Stable unremarkable CT of the head. Electronically Signed   By: Mitzi Hansen M.D.   On: 11/22/2017 17:52    Procedures Procedures (including critical care time)  Medications Ordered in ED Medications  prochlorperazine (COMPAZINE) injection 10 mg (10 mg Intravenous Given 11/22/17 1708)  diphenhydrAMINE (BENADRYL) injection 25 mg (25 mg Intravenous Given 11/22/17 1706)  ketorolac (TORADOL) 30 MG/ML injection 30 mg (30 mg Intravenous Given 11/22/17 1707)  sodium  chloride 0.9 % bolus 500 mL (0 mLs Intravenous Stopped 11/22/17 1955)     Initial Impression / Assessment and Plan / ED Course  I have reviewed the triage vital signs and the nursing notes.  Pertinent labs & imaging results that were available during my care of the patient were reviewed by me and considered in my medical decision making (see chart for details).     52 year old female with a history of OSA on BiPAP, GERD, and prediabetes presented with headache for the last 7 days and intermittent episodes of dizziness since yesterday.  No other associated symptoms.  Vital signs are reassuring and she is hemodynamically stable.  On exam, the patient has no neurologic deficits.  CT head is unremarkable.  Doubt CVA, ICH, SAH, or meningitis.  Patient was discussed with Dr. Freida Busman, attending physician.  Will give migraine cocktail of Toradol, Compazine, and Benadryl with a 500 cc bolus of IV fluids and reassess.  On reevaluation, patient reports her headache has completely resolved.  Will discharge with meclizine for intermittent dizziness and is a referral to follow-up with neurology.  Recommended she follow-up with her PCP regarding her changes in blood pressure she is concerned she may need to start medication.  Will defer starting antihypertensives at this time given that the patient's blood pressure is 122/54.  Strict return precautions given.  She is hemodynamically stable and in no acute distress.  She is safe for discharge home with outpatient follow-up at this time.  Final Clinical Impressions(s) / ED Diagnoses   Final diagnoses:  Bad headache  Dizziness    ED Discharge Orders         Ordered    meclizine (ANTIVERT) 25 MG tablet  3 times daily PRN     11/22/17 1925          Barkley Boards, PA-C 11/23/17 Frohna Callas, MD 11/23/17 1752

## 2017-11-22 NOTE — Discharge Instructions (Addendum)
Thank you for allowing me to care for you today in the Emergency Department.   Follow up with primary care regarding your blood pressure. Continue to keep a log of your blood pressure at home.   You can follow up with neurology if you develop returning headaches. Try taking 1 tablet of meclizine every 6 hours for episodes of dizziness.  You can take 600 mg of ibuprofen or 650 mg of Tylenol every 6 hours if you develop another headache.  Avoid BC and Goody powder as these have caffeine and sometimes other ingredients and can cause worsening rebound headaches.  Return to the emergency department if you develop a severe headache with significantly elevated blood pressure, new numbness, weakness, slurred speech, facial droop, changes in your vision, or other new, concerning symptoms,

## 2017-12-17 ENCOUNTER — Ambulatory Visit: Payer: Self-pay

## 2018-02-01 ENCOUNTER — Ambulatory Visit: Payer: Self-pay

## 2018-02-04 ENCOUNTER — Ambulatory Visit: Payer: Self-pay

## 2018-03-11 ENCOUNTER — Ambulatory Visit: Payer: Self-pay

## 2018-04-09 ENCOUNTER — Other Ambulatory Visit: Payer: Self-pay | Admitting: Nurse Practitioner

## 2018-04-09 DIAGNOSIS — Z1231 Encounter for screening mammogram for malignant neoplasm of breast: Secondary | ICD-10-CM

## 2018-06-02 ENCOUNTER — Ambulatory Visit: Payer: Self-pay

## 2018-07-11 ENCOUNTER — Ambulatory Visit (HOSPITAL_COMMUNITY)
Admission: EM | Admit: 2018-07-11 | Discharge: 2018-07-11 | Disposition: A | Discharge status: Home / Self Care | Payer: BC Managed Care – PPO

## 2018-07-11 ENCOUNTER — Encounter (HOSPITAL_COMMUNITY): Payer: Self-pay | Admitting: *Deleted

## 2018-07-11 ENCOUNTER — Other Ambulatory Visit: Payer: Self-pay

## 2018-07-11 DIAGNOSIS — M546 Pain in thoracic spine: Secondary | ICD-10-CM | POA: Diagnosis not present

## 2018-07-11 DIAGNOSIS — M545 Low back pain, unspecified: Secondary | ICD-10-CM

## 2018-07-11 HISTORY — DX: Nonscarring hair loss, unspecified: L65.9

## 2018-07-11 HISTORY — DX: Essential (primary) hypertension: I10

## 2018-07-11 MED ORDER — KETOROLAC TROMETHAMINE 30 MG/ML IJ SOLN
INTRAMUSCULAR | Status: AC
  Filled 2018-07-11: qty 1

## 2018-07-11 MED ORDER — KETOROLAC TROMETHAMINE 30 MG/ML IJ SOLN
30.0000 mg | Freq: Once | INTRAMUSCULAR | Status: AC
  Administered 2018-07-11: 30 mg via INTRAMUSCULAR

## 2018-07-11 MED ORDER — METHOCARBAMOL 500 MG PO TABS: 500.0000 mg | ORAL_TABLET | Freq: Two times a day (BID) | ORAL | 0 refills | Status: DC

## 2018-07-11 MED ORDER — MELOXICAM 7.5 MG PO TABS: 7.5000 mg | ORAL_TABLET | Freq: Every day | ORAL | 0 refills | Status: DC
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
## 2019-03-21 ENCOUNTER — Other Ambulatory Visit: Payer: Self-pay | Admitting: Nurse Practitioner

## 2019-03-21 DIAGNOSIS — Z1231 Encounter for screening mammogram for malignant neoplasm of breast: Secondary | ICD-10-CM

## 2019-03-25 ENCOUNTER — Ambulatory Visit (HOSPITAL_COMMUNITY)
Admission: EM | Admit: 2019-03-25 | Discharge: 2019-03-25 | Disposition: A | Discharge status: Home / Self Care | Payer: BC Managed Care – PPO

## 2019-03-25 ENCOUNTER — Encounter (HOSPITAL_COMMUNITY): Payer: Self-pay

## 2019-03-25 ENCOUNTER — Other Ambulatory Visit: Payer: Self-pay

## 2019-03-25 DIAGNOSIS — F419 Anxiety disorder, unspecified: Secondary | ICD-10-CM

## 2019-03-25 DIAGNOSIS — R202 Paresthesia of skin: Secondary | ICD-10-CM | POA: Diagnosis not present

## 2019-03-25 HISTORY — DX: Anxiety disorder, unspecified: F41.9

## 2019-03-25 NOTE — ED Triage Notes (Signed)
Pt states she had "really bad" left sided HA this evening at approx 1740.  Took Buspirone 10mg .  HA went away.  Reports intermittent tingling sensation in same area as HA that travels down head, left side of neck and into left shoulder.  States her left arm felt tired yesterday but no decrease in strength.  Also reports episode of dizziness earlier today.  Doesn't feel like herself but has recently been dealing with more anxiety and never feels herself when her anxiety is worse.  At end of triage states tingling is now going all the way down left arm.

## 2019-03-25 NOTE — Discharge Instructions (Signed)
You are free to take 2 doses of your BuSpar at once to help relieve your anxiety.  If your anxiety medicines seems to be insufficient in relieving her symptoms I would recommend a sooner follow-up with your primary to maybe consider being placed on a different medication.  If your symptoms were to worsen or you develop any slurring of speech I would recommend going immediately to the ER for further evaluation.

## 2019-03-25 NOTE — ED Provider Notes (Addendum)
[facility]    CSN: [id number] Arrival date & time: 03/25/19  1910      History   Chief Complaint Chief Complaint  Patient presents with  . Numbness    HPI Erica Odom is a 54 y.o. female.   HPI Presents today for evaluation of numbness and tingling sensation involving the left side of head and left arm. Patient endorses experiencing left arm numbness and tingling over the last few weeks which she associated with recent anxiety attacks. PCP recently started her on Buspar which she has tolerated and medication has help reduce anxiety. She continues to be under a large amount of stress related to work environment. She notices her symptoms of anxiety resolve over the weekend and immediately start hours before time to start work, subsequently continue during the week. She is not experiencing facial drooping, slurring of speech, weakness of extremity, dizziness, changes in vision, chest pain, or shortness of breath. Past Medical History:  Diagnosis Date  . Alopecia   . Anxiety   . Baker's cyst of knee   . GERD (gastroesophageal reflux disease)   . Hypertension   . Prediabetes   . Sleep apnea     There are no problems to display for this patient.   Past Surgical History:  Procedure Laterality Date  . APPENDECTOMY    . HEMORRHOID SURGERY    . KNEE SURGERY      OB History   No obstetric history on file.      Home Medications    Prior to Admission medications   Medication Sig Start Date End Date Taking? Authorizing Provider  Ascorbic Acid (VITAMIN C PO) Take by mouth.   Yes [provider]  busPIRone (BUSPAR) 10 MG tablet Take 10 mg by mouth 3 (three) times daily.   Yes [provider]  chlorthalidone (HYGROTON) 25 MG tablet Take 25 mg by mouth daily.   Yes [provider]  MAGNESIUM PO Take by mouth.   Yes [provider]  Multiple Vitamin (MULTIVITAMIN) capsule Take 1 capsule by mouth daily.   Yes [provider]  VITAMIN D PO Take by mouth.   Yes [provider]  CLONIDINE HCL PO Take by mouth.    [provider]  ibuprofen (ADVIL,MOTRIN) 200 MG tablet Take 800 mg by mouth every 6 (six) hours as needed for moderate pain.    [provider]  meclizine (ANTIVERT) 25 MG tablet Take 1 tablet (25 mg total) by mouth 3 (three) times daily as needed for dizziness. 11/22/17   McDonald, Mia A, PA-C  meloxicam (MOBIC) 7.5 MG tablet Take 1 tablet (7.5 mg total) by mouth daily. 07/11/18   Cathie Hoops, Amy V, PA-C  methocarbamol (ROBAXIN) 500 MG tablet Take 1 tablet (500 mg total) by mouth 2 (two) times daily. 07/11/18   Cathie Hoops, Amy V, PA-C  POTASSIUM PO Take by mouth.    [provider]  Probiotic Product (PROBIOTIC PO) Take by mouth.    [provider]    Family History Family History  Problem Relation Age of Onset  . Hypertension Mother   . Diabetes Mother   . Hypercholesterolemia Mother   . Cancer Father   . Hypertension Sister   . Diabetes Sister     Social History Social History   Tobacco Use  . Smoking status: Former Games developer  . Smokeless tobacco: Never Used  Substance Use Topics  . Alcohol use: Yes    Comment: occasionally  . Drug  use: Never     Allergies   Oxycodone-acetaminophen and Erythromycin   Review of Systems Review of Systems Pertinent negatives listed in HPI Physical Exam Triage Vital Signs ED Triage Vitals  Enc Vitals Group     BP 03/25/19 1953 128/73     Pulse Rate 03/25/19 1953 90     Resp 03/25/19 1953 18     Temp 03/25/19 1953 98.6 F (37 C)     Temp Source 03/25/19 1953 Oral     SpO2 03/25/19 1953 100 %     Weight --      Height --      Head Circumference --      Peak Flow --      Pain Score 03/25/19 1944 0     Pain Loc --      Pain Edu? --      Excl. in [place]? --    No data found.  Updated Vital Signs BP 128/73 (BP Location: Right Arm)   Pulse 90   Temp 98.6 F (37 C) (Oral)   Resp 18   SpO2 100%    Visual Acuity Right Eye Distance:   Left Eye Distance:   Bilateral Distance:    Right Eye Near:   Left Eye Near:    Bilateral Near:     Physical Exam Constitutional:      Appearance: Normal appearance.  HENT:     Head: Normocephalic.     Nose: Nose normal.  Cardiovascular:     Rate and Rhythm: Normal rate and regular rhythm.  Pulmonary:     Effort: Pulmonary effort is normal.     Breath sounds: Normal breath sounds.  Musculoskeletal:        General: Normal range of motion.     Cervical back: Normal range of motion.  Skin:    General: Skin is warm and dry.     Capillary Refill: Capillary refill takes less than 2 seconds.  Neurological:     General: No focal deficit present.     Mental Status: She is alert and oriented to person, place, and time.     Sensory: No sensory deficit.     Motor: No weakness.     Coordination: Coordination normal.     Gait: Gait normal.  Psychiatric:        Mood and Affect: Mood normal.        Behavior: Behavior normal.      UC Treatments / Results  Labs (all labs ordered are listed, but only abnormal results are displayed) Labs Reviewed - No data to display  EKG   Radiology No results found.  Procedures Procedures (including critical care time)  Medications Ordered in UC Medications - No data to display  Initial Impression / Assessment and Plan / UC Course  I have reviewed the triage vital signs and the nursing notes.  Pertinent labs & imaging results that were available during my care of the patient were reviewed by me and considered in my medical decision making (see chart for details).    1.Paresthesias 2. Anxiety Physical exam with attention to neurological exam reassuring.  Suspect current symptoms are related to anxiety. Recommend increase Buspar 20 mg twice daily and following up with PCP to discuss other options for anxiety management. Red Flags discussed and strict ER follow-up warranted if symptoms  worsen.    Final Clinical Impressions(s) / UC Diagnoses   Final diagnoses:  Paresthesias  Anxiety     Discharge Instructions  You are free to take 2 doses of your BuSpar at once to help relieve your anxiety.  If your anxiety medicines seems to be insufficient in relieving her symptoms I would recommend a sooner follow-up with your primary to maybe consider being placed on a different medication.  If your symptoms were to worsen or you develop any slurring of speech I would recommend going immediately to the ER for further evaluation.    ED Prescriptions    None     PDMP not reviewed this encounter.   Bing Neighbors, FNP 03/27/19 1655    Bing Neighbors, FNP 03/27/19 [phone]

## 2019-04-21 ENCOUNTER — Other Ambulatory Visit: Payer: Self-pay

## 2019-04-21 ENCOUNTER — Ambulatory Visit
Admission: RE | Admit: 2019-04-21 | Discharge: 2019-04-21 | Disposition: A | Discharge status: Home / Self Care | Payer: BC Managed Care – PPO | Source: Ambulatory Visit | Attending: Nurse Practitioner | Admitting: Nurse Practitioner

## 2019-04-21 DIAGNOSIS — Z1231 Encounter for screening mammogram for malignant neoplasm of breast: Secondary | ICD-10-CM

## 2019-05-30 ENCOUNTER — Other Ambulatory Visit: Payer: Self-pay

## 2019-05-30 ENCOUNTER — Encounter (HOSPITAL_COMMUNITY): Payer: Self-pay | Admitting: Emergency Medicine

## 2019-05-30 ENCOUNTER — Inpatient Hospital Stay (HOSPITAL_COMMUNITY)
Admission: EM | Admit: 2019-05-30 | Discharge: 2019-06-03 | DRG: 177 | Disposition: A | Discharge status: Home Health | Payer: BC Managed Care – PPO | Attending: Internal Medicine | Admitting: Internal Medicine

## 2019-05-30 ENCOUNTER — Emergency Department (HOSPITAL_COMMUNITY): Payer: BC Managed Care – PPO

## 2019-05-30 DIAGNOSIS — R7303 Prediabetes: Secondary | ICD-10-CM

## 2019-05-30 DIAGNOSIS — Z6841 Body Mass Index (BMI) 40.0 and over, adult: Secondary | ICD-10-CM

## 2019-05-30 DIAGNOSIS — I10 Essential (primary) hypertension: Secondary | ICD-10-CM | POA: Diagnosis present

## 2019-05-30 DIAGNOSIS — G8929 Other chronic pain: Secondary | ICD-10-CM | POA: Diagnosis present

## 2019-05-30 DIAGNOSIS — J9601 Acute respiratory failure with hypoxia: Secondary | ICD-10-CM | POA: Diagnosis present

## 2019-05-30 DIAGNOSIS — R509 Fever, unspecified: Secondary | ICD-10-CM | POA: Insufficient documentation

## 2019-05-30 DIAGNOSIS — F329 Major depressive disorder, single episode, unspecified: Secondary | ICD-10-CM | POA: Diagnosis present

## 2019-05-30 DIAGNOSIS — E66813 Obesity, class 3: Secondary | ICD-10-CM

## 2019-05-30 DIAGNOSIS — E871 Hypo-osmolality and hyponatremia: Secondary | ICD-10-CM | POA: Diagnosis present

## 2019-05-30 DIAGNOSIS — Z87891 Personal history of nicotine dependence: Secondary | ICD-10-CM

## 2019-05-30 DIAGNOSIS — J1282 Pneumonia due to coronavirus disease 2019: Secondary | ICD-10-CM | POA: Diagnosis present

## 2019-05-30 DIAGNOSIS — E860 Dehydration: Secondary | ICD-10-CM | POA: Diagnosis present

## 2019-05-30 DIAGNOSIS — U071 COVID-19: Secondary | ICD-10-CM | POA: Diagnosis present

## 2019-05-30 DIAGNOSIS — N179 Acute kidney failure, unspecified: Secondary | ICD-10-CM | POA: Diagnosis present

## 2019-05-30 DIAGNOSIS — R739 Hyperglycemia, unspecified: Secondary | ICD-10-CM | POA: Diagnosis not present

## 2019-05-30 DIAGNOSIS — Z79899 Other long term (current) drug therapy: Secondary | ICD-10-CM | POA: Diagnosis not present

## 2019-05-30 DIAGNOSIS — T380X5A Adverse effect of glucocorticoids and synthetic analogues, initial encounter: Secondary | ICD-10-CM | POA: Diagnosis not present

## 2019-05-30 DIAGNOSIS — E669 Obesity, unspecified: Secondary | ICD-10-CM | POA: Diagnosis not present

## 2019-05-30 DIAGNOSIS — K219 Gastro-esophageal reflux disease without esophagitis: Secondary | ICD-10-CM | POA: Diagnosis present

## 2019-05-30 LAB — COMPREHENSIVE METABOLIC PANEL
ALT: 22 U/L (ref 0–44)
AST: 45 U/L — ABNORMAL HIGH (ref 15–41)
Albumin: 3.4 g/dL — ABNORMAL LOW (ref 3.5–5.0)
Alkaline Phosphatase: 46 U/L (ref 38–126)
Anion gap: 13 (ref 5–15)
BUN: 24 mg/dL — ABNORMAL HIGH (ref 6–20)
CO2: 23 mmol/L (ref 22–32)
Calcium: 8.1 mg/dL — ABNORMAL LOW (ref 8.9–10.3)
Chloride: 98 mmol/L (ref 98–111)
Creatinine, Ser: 1.48 mg/dL — ABNORMAL HIGH (ref 0.44–1.00)
GFR calc Af Amer: 45 mL/min — ABNORMAL LOW (ref >60)
GFR calc non Af Amer: 39 mL/min — ABNORMAL LOW (ref >60)
Glucose, Bld: 105 mg/dL — ABNORMAL HIGH (ref 70–99)
Potassium: 4.3 mmol/L (ref 3.5–5.1)
Sodium: 134 mmol/L — ABNORMAL LOW (ref 135–145)
Total Bilirubin: 1.1 mg/dL (ref 0.3–1.2)
Total Protein: 7.3 g/dL (ref 6.5–8.1)

## 2019-05-30 LAB — CBC WITH DIFFERENTIAL/PLATELET
Abs Immature Granulocytes: 0.02 10*3/uL (ref 0.00–0.07)
Basophils Absolute: 0 10*3/uL (ref 0.0–0.1)
Basophils Relative: 0 %
Eosinophils Absolute: 0 10*3/uL (ref 0.0–0.5)
Eosinophils Relative: 0 %
HCT: 36.6 % (ref 36.0–46.0)
Hemoglobin: 12 g/dL (ref 12.0–15.0)
Immature Granulocytes: 1 %
Lymphocytes Relative: 33 %
Lymphs Abs: 1.4 10*3/uL (ref 0.7–4.0)
MCH: 26.6 pg (ref 26.0–34.0)
MCHC: 32.8 g/dL (ref 30.0–36.0)
MCV: 81.2 fL (ref 80.0–100.0)
Monocytes Absolute: 0.2 10*3/uL (ref 0.1–1.0)
Monocytes Relative: 5 %
Neutro Abs: 2.7 10*3/uL (ref 1.7–7.7)
Neutrophils Relative %: 61 %
Platelets: 246 10*3/uL (ref 150–400)
RBC: 4.51 MIL/uL (ref 3.87–5.11)
RDW: 15.4 % (ref 11.5–15.5)
WBC: 4.3 10*3/uL (ref 4.0–10.5)
nRBC: 0 % (ref 0.0–0.2)

## 2019-05-30 LAB — PROCALCITONIN: Procalcitonin: <0.1 ng/mL

## 2019-05-30 LAB — I-STAT BETA HCG BLOOD, ED (MC, WL, AP ONLY): I-stat hCG, quantitative: <5 m[IU]/mL (ref <5)

## 2019-05-30 LAB — C-REACTIVE PROTEIN: CRP: 8.8 mg/dL — ABNORMAL HIGH (ref <1.0)

## 2019-05-30 LAB — SARS CORONAVIRUS 2 BY RT PCR (HOSPITAL ORDER, PERFORMED IN ~~LOC~~ HOSPITAL LAB): SARS Coronavirus 2: POSITIVE — AB

## 2019-05-30 LAB — D-DIMER, QUANTITATIVE: D-Dimer, Quant: 0.99 ug/mL-FEU — ABNORMAL HIGH (ref 0.00–0.50)

## 2019-05-30 LAB — TRIGLYCERIDES: Triglycerides: 161 mg/dL — ABNORMAL HIGH (ref <150)

## 2019-05-30 LAB — FIBRINOGEN: Fibrinogen: 525 mg/dL — ABNORMAL HIGH (ref 210–475)

## 2019-05-30 LAB — FERRITIN: Ferritin: 229 ng/mL (ref 11–307)

## 2019-05-30 LAB — LACTATE DEHYDROGENASE: LDH: 338 U/L — ABNORMAL HIGH (ref 98–192)

## 2019-05-30 LAB — LACTIC ACID, PLASMA: Lactic Acid, Venous: 0.8 mmol/L (ref 0.5–1.9)

## 2019-05-30 MED ORDER — ONDANSETRON HCL 4 MG/2ML IJ SOLN: 4.0000 mg | Freq: Four times a day (QID) | INTRAMUSCULAR | Status: DC | PRN

## 2019-05-30 MED ORDER — DEXAMETHASONE SODIUM PHOSPHATE 10 MG/ML IJ SOLN
10.0000 mg | Freq: Once | INTRAMUSCULAR | Status: AC
  Administered 2019-05-30: 10 mg via INTRAVENOUS
  Filled 2019-05-30: qty 1

## 2019-05-30 MED ORDER — ACETAMINOPHEN 325 MG PO TABS
650.0000 mg | ORAL_TABLET | Freq: Four times a day (QID) | ORAL | Status: DC | PRN
  Administered 2019-05-31: 650 mg via ORAL
  Filled 2019-05-30: qty 2

## 2019-05-30 MED ORDER — ENOXAPARIN SODIUM 60 MG/0.6ML ~~LOC~~ SOLN
60.0000 mg | SUBCUTANEOUS | Status: DC
  Administered 2019-05-30 – 2019-05-31 (×2): 60 mg via SUBCUTANEOUS
  Filled 2019-05-30 (×2): qty 0.6

## 2019-05-30 MED ORDER — BUPROPION HCL ER (XL) 300 MG PO TB24
300.0000 mg | ORAL_TABLET | Freq: Every morning | ORAL | Status: DC
  Administered 2019-06-03: 300 mg via ORAL
  Filled 2019-05-30 (×3): qty 1

## 2019-05-30 MED ORDER — SODIUM CHLORIDE 0.9 % IV SOLN
100.0000 mg | Freq: Once | INTRAVENOUS | Status: AC
  Administered 2019-05-30: 100 mg via INTRAVENOUS
  Filled 2019-05-30: qty 20

## 2019-05-30 MED ORDER — HYDROCODONE-HOMATROPINE 5-1.5 MG/5ML PO SYRP
5.0000 mL | ORAL_SOLUTION | ORAL | Status: DC | PRN
  Administered 2019-05-30 – 2019-06-02 (×3): 5 mL via ORAL
  Filled 2019-05-30 (×3): qty 5

## 2019-05-30 MED ORDER — DEXAMETHASONE SODIUM PHOSPHATE 10 MG/ML IJ SOLN
6.0000 mg | INTRAMUSCULAR | Status: DC
  Administered 2019-05-30 – 2019-06-02 (×4): 6 mg via INTRAVENOUS
  Filled 2019-05-30 (×4): qty 1

## 2019-05-30 MED ORDER — IBUPROFEN 800 MG PO TABS
800.0000 mg | ORAL_TABLET | Freq: Once | ORAL | Status: AC
  Administered 2019-05-30: 800 mg via ORAL
  Filled 2019-05-30: qty 1

## 2019-05-30 MED ORDER — ACETAMINOPHEN 325 MG PO TABS
650.0000 mg | ORAL_TABLET | Freq: Once | ORAL | Status: AC
  Administered 2019-05-30: 650 mg via ORAL
  Filled 2019-05-30: qty 2

## 2019-05-30 MED ORDER — DOCUSATE SODIUM 100 MG PO CAPS
100.0000 mg | ORAL_CAPSULE | Freq: Two times a day (BID) | ORAL | Status: DC
  Administered 2019-05-30 – 2019-06-03 (×8): 100 mg via ORAL
  Filled 2019-05-30 (×7): qty 1

## 2019-05-30 MED ORDER — ALBUTEROL SULFATE HFA 108 (90 BASE) MCG/ACT IN AERS
4.0000 | INHALATION_SPRAY | RESPIRATORY_TRACT | Status: DC | PRN
  Administered 2019-05-30 – 2019-06-03 (×4): 4 via RESPIRATORY_TRACT
  Filled 2019-05-30 (×2): qty 6.7

## 2019-05-30 MED ORDER — SODIUM CHLORIDE 0.9 % IV SOLN
100.0000 mg | Freq: Every day | INTRAVENOUS | Status: AC
  Administered 2019-05-31 – 2019-06-03 (×4): 100 mg via INTRAVENOUS
  Filled 2019-05-30 (×4): qty 20

## 2019-05-30 MED ORDER — FLUTICASONE PROPIONATE 50 MCG/ACT NA SUSP
1.0000 | Freq: Every day | NASAL | Status: DC
  Filled 2019-05-30: qty 16

## 2019-05-30 MED ORDER — ONDANSETRON HCL 4 MG PO TABS: 4.0000 mg | ORAL_TABLET | Freq: Four times a day (QID) | ORAL | Status: DC | PRN

## 2019-05-30 NOTE — ED Provider Notes (Signed)
Signout note  53 year old lady presenting with Covid, increasing shortness of breath.  Mildly hypoxic on RA, started on 2 L nasal cannula.  Started on Decadron, remdesivir.  Labs pending.  Anticipate admission to hospitalist service.  3:30 PM Received signout from [facility]  5:11 PM Reviewed labs, will consult hospitalist for admit.   Milagros Loll, MD 05/30/19 [phone]

## 2019-05-30 NOTE — ED Notes (Signed)
Pharmacy notified need for medication

## 2019-05-30 NOTE — H&P (Signed)
History and Physical  Erica Odom WEX:[id number] DOB: 03/16/1965 DOA: 05/30/2019   PCP: Stevphen Rochester, MD   Patient coming from: Home  Chief Complaint: Weakness, shortness of breath  HPI: Erica Odom is a 54 y.o. female with medical history significant for depression, GERD, hypertension, prediabetes being admitted to the hospital with Covid pneumonia.  Patient tells me that her symptoms started about 9 days ago, when she had some itchy eyes and runny nose, which she attributed to allergies.  2 days later, she had developed a dry cough, was feeling little bit weak so she went to get tested, this is on Monday 5/10.  And she tested positive.  She was quarantining at home, but over the last week she has developed a worsening productive cough, she says she has also had daily fevers, denies any chest pain nausea vomiting, she has also lost taste and smell about a week ago.  Due to feeling continuous weakness, and having fevers with significant cough, she came to her PCP for evaluation, they sent her to the emergency department for further care.  ED Course: In the emergency department, lab work-up revealed that she was saturating 90% on room air so she was placed on 2 L of oxygen.  Chest x-ray shows bilateral changes consistent with viral pneumonia.  Procalcitonin is negative, CRP and ferritin are elevated.  Patient was given a dose of IV Decadron as well as pharmacy was contacted for remdesivir.  Hospitalist was contacted for admission.  Review of Systems: Please see HPI for pertinent positives and negatives. A complete 10 system review of systems are otherwise negative.  Past Medical History:  Diagnosis Date  . Alopecia   . Anxiety   . Baker's cyst of knee   . GERD (gastroesophageal reflux disease)   . Hypertension   . Prediabetes   . Sleep apnea    Past Surgical History:  Procedure Laterality Date  . APPENDECTOMY    . HEMORRHOID SURGERY    . KNEE  SURGERY      Social History:  reports that she has quit smoking. She has never used smokeless tobacco. She reports current alcohol use. She reports that she does not use drugs.   Allergies  Allergen Reactions  . Oxycodone-Acetaminophen Itching    Tylox  . Erythromycin     Cant remember    Family History  Problem Relation Age of Onset  . Hypertension Mother   . Diabetes Mother   . Hypercholesterolemia Mother   . Cancer Father   . Hypertension Sister   . Diabetes Sister      Prior to Admission medications   Medication Sig Start Date End Date Taking? Authorizing Provider  acetaminophen (TYLENOL) 325 MG tablet Take 650 mg by mouth every 6 (six) hours as needed for mild pain, fever or headache.   Yes [provider]  Ascorbic Acid (VITAMIN C PO) Take 1 tablet by mouth daily.    Yes [provider]  buPROPion (WELLBUTRIN XL) 300 MG 24 hr tablet Take 300 mg by mouth every morning. 03/21/19  Yes [provider]  chlorthalidone (HYGROTON) 25 MG tablet Take 25 mg by mouth daily.   Yes [provider]  fluticasone (FLONASE) 50 MCG/ACT nasal spray Place 1 spray into both nostrils daily. 05/16/19  Yes [provider]  Multiple Vitamin (MULTIVITAMIN) capsule Take 1 capsule by mouth daily.   Yes [provider]  VITAMIN D PO Take 1 tablet by mouth daily.  Yes [provider]  meclizine (ANTIVERT) 25 MG tablet Take 1 tablet (25 mg total) by mouth 3 (three) times daily as needed for dizziness. Patient not taking: Reported on 05/30/2019 11/22/17   McDonald, Pedro Earls A, PA-C  meloxicam (MOBIC) 7.5 MG tablet Take 1 tablet (7.5 mg total) by mouth daily. Patient not taking: Reported on 05/30/2019 07/11/18   Belinda Fisher, PA-C  methocarbamol (ROBAXIN) 500 MG tablet Take 1 tablet (500 mg total) by mouth 2 (two) times daily. Patient not taking: Reported on 05/30/2019 07/11/18   Lurline Idol    Physical Exam: BP 109/64   Pulse 98   Temp (!) 100.5  F (38.1 C) (Oral)   Resp (!) 25   SpO2 96%   General:  Alert, oriented, calm, in no acute distress  Eyes: EOMI, clear conjuctivae, white sclerea Neck: supple, no masses, trachea mildline  Cardiovascular: RRR, no murmurs or rubs, no peripheral edema  Respiratory: clear to auscultation bilaterally, no wheezes, no crackles  Abdomen: soft, nontender, nondistended, normal bowel tones heard  Skin: dry, no rashes  Musculoskeletal: no joint effusions, normal range of motion  Psychiatric: appropriate affect, normal speech  Neurologic: extraocular muscles intact, clear speech, moving all extremities with intact sensorium            Labs on Admission:  Basic Metabolic Panel: Recent Labs  Lab 05/30/19 1525  NA 134*  K 4.3  CL 98  CO2 23  GLUCOSE 105*  BUN 24*  CREATININE 1.48*  CALCIUM 8.1*   Liver Function Tests: Recent Labs  Lab 05/30/19 1525  AST 45*  ALT 22  ALKPHOS 46  BILITOT 1.1  PROT 7.3  ALBUMIN 3.4*   No results for input(s): LIPASE, AMYLASE in the last 168 hours. No results for input(s): AMMONIA in the last 168 hours. CBC: Recent Labs  Lab 05/30/19 1525  WBC 4.3  NEUTROABS 2.7  HGB 12.0  HCT 36.6  MCV 81.2  PLT 246   Cardiac Enzymes: No results for input(s): CKTOTAL, CKMB, CKMBINDEX, TROPONINI in the last 168 hours.  BNP (last 3 results) No results for input(s): BNP in the last 8760 hours.  ProBNP (last 3 results) No results for input(s): PROBNP in the last 8760 hours.  CBG: No results for input(s): GLUCAP in the last 168 hours.  Radiological Exams on Admission: DG Chest Port 1 View  Result Date: 05/30/2019 CLINICAL DATA:  COVID positive EXAM: PORTABLE CHEST 1 VIEW COMPARISON:  02/17/2025 FINDINGS: Patchy bibasilar infiltrates consistent with pneumonia. No effusion or heart failure. Heart size within normal limits. IMPRESSION: Bilateral infiltrates compatible with pneumonia Electronically Signed   By: Marlan Palau M.D.   On: 05/30/2019 14:23     Assessment/Plan Present on Admission: . COVID-19 . Hyponatremia . AKI (acute kidney injury) (HCC)  Active Problems:   COVID-19 -inpatient admission, supplemental oxygen to keep O2 saturations above 90% with exertion, will start remdesivir per pharmacy consult, as well as IV Decadron daily x10 days.  Note that she has no leukocytosis, procalcitonin is negative, no indication for IV antibiotics.   Hyponatremia -mild and inconsequential at this time, will follow daily   AKI (acute kidney injury) (HCC) -likely due to dehydration and insensible fluid losses from the patient's persistent fever, will hold her thiazide diuretic, hydrate gently and follow renal function daily. Hypertension-we will hold the patient's home thiazide diuretic due to her acute kidney injury.   Fever-due to Covid pneumonia, Tylenol as needed  DVT prophylaxis: Lovenox and SCDs  Code Status: Full code  Family Communication: Patient alert and oriented, plan discussed with her.  Disposition Plan: Patient will discharge home, may need oxygen.  Consults called: None  Admission status: Inpatient MedSurg  Time spent: 35 minutes  Ayden Apodaca Marry Guan MD [facility] Pager [phone]  If 7PM-7AM, please contact night-coverage [email or web address] Password [facility]  05/30/2019, 5:58 PM

## 2019-05-30 NOTE — ED Triage Notes (Addendum)
Pt reports that she tested Covid+ last Monday at [facility]. reports had visit with her PCP who advised pt to go to Ed for further evaluation due to exertional SOB and fevers. Reports that she gets into coughing spells and then has hard time catching her breath.

## 2019-05-30 NOTE — ED Provider Notes (Signed)
[facility]-EMERGENCY DEPT Provider Note   CSN: [id number] Arrival date & time: 05/30/19  1141     History Chief Complaint  Patient presents with  . Covid positive  . Fever    Erica Odom is a 54 y.o. female.  HPI Patient reports he tested positive for Covid a week ago.  She reports that she has gotten increasingly harsh cough and now gets short of breath with very limited exertion.  She reports she is extremely fatigued all the time.  She reports she has a fever every day and its not going away.  No vomiting or diarrhea.  She is tolerating oral intake.  She reports sometimes she does have such a harsh coughing episode that she might throw up.  She reports when she goes into a coughing episode she does get very short of breath as well.  She is unsure of any possible sick contacts.  Patient does have grown children with grandchildren but has been avoiding contact to avoid getting Covid.  Patient has not had vaccine.  She was not comfortable getting it until it has been in use for couple of years.  She reports she went to her doctor but they were concerned about how hard she was coughing and how short of breath she was getting with cough and referred her to the emergency department.    Past Medical History:  Diagnosis Date  . Alopecia   . Anxiety   . Baker's cyst of knee   . GERD (gastroesophageal reflux disease)   . Hypertension   . Prediabetes   . Sleep apnea     There are no problems to display for this patient.   Past Surgical History:  Procedure Laterality Date  . APPENDECTOMY    . HEMORRHOID SURGERY    . KNEE SURGERY       OB History   No obstetric history on file.     Family History  Problem Relation Age of Onset  . Hypertension Mother   . Diabetes Mother   . Hypercholesterolemia Mother   . Cancer Father   . Hypertension Sister   . Diabetes Sister     Social History   Tobacco Use  . Smoking status: Former Games developer  .  Smokeless tobacco: Never Used  Substance Use Topics  . Alcohol use: Yes    Comment: occasionally  . Drug use: Never    Home Medications Prior to Admission medications   Medication Sig Start Date End Date Taking? Authorizing Provider  acetaminophen (TYLENOL) 325 MG tablet Take 650 mg by mouth every 6 (six) hours as needed for mild pain, fever or headache.   Yes [provider]  Ascorbic Acid (VITAMIN C PO) Take 1 tablet by mouth daily.    Yes [provider]  buPROPion (WELLBUTRIN XL) 300 MG 24 hr tablet Take 300 mg by mouth every morning. 03/21/19  Yes [provider]  chlorthalidone (HYGROTON) 25 MG tablet Take 25 mg by mouth daily.   Yes [provider]  fluticasone (FLONASE) 50 MCG/ACT nasal spray Place 1 spray into both nostrils daily. 05/16/19  Yes [provider]  Multiple Vitamin (MULTIVITAMIN) capsule Take 1 capsule by mouth daily.   Yes [provider]  VITAMIN D PO Take 1 tablet by mouth daily.    Yes [provider]  meclizine (ANTIVERT) 25 MG tablet Take 1 tablet (25 mg total) by mouth 3 (three) times daily as needed for dizziness. Patient not taking: Reported  on 05/30/2019 11/22/17   McDonald, Maree Erie A, PA-C  meloxicam (MOBIC) 7.5 MG tablet Take 1 tablet (7.5 mg total) by mouth daily. Patient not taking: Reported on 05/30/2019 07/11/18   Ok Edwards, PA-C  methocarbamol (ROBAXIN) 500 MG tablet Take 1 tablet (500 mg total) by mouth 2 (two) times daily. Patient not taking: Reported on 05/30/2019 07/11/18   Ok Edwards, PA-C    Allergies    Oxycodone-acetaminophen and Erythromycin  Review of Systems   Review of Systems 10 Systems reviewed and are negative for acute change except as noted in the HPI.  Physical Exam Updated Vital Signs BP (!) 100/55   Pulse (!) 103   Temp (!) 100.5 F (38.1 C) (Oral)   Resp (!) 29   SpO2 97%   Physical Exam Constitutional:      Comments: Alert and appropriate.  Tachypnea.  No  respiratory distress at rest.  Speaking in full sentences.  HENT:     Head: Normocephalic and atraumatic.  Eyes:     Extraocular Movements: Extraocular movements intact.  Cardiovascular:     Heart sounds: Normal heart sounds.     Comments: Borderline tachycardia.  Narrow complex on monitor sinus rhythm. Pulmonary:     Comments: Tachypnea.  No respiratory distress at rest.  Scattered faint crackles. Abdominal:     General: There is no distension.     Palpations: Abdomen is soft.     Tenderness: There is no abdominal tenderness. There is no guarding.  Musculoskeletal:        General: No swelling or tenderness. Normal range of motion.     Right lower leg: No edema.     Left lower leg: No edema.  Skin:    General: Skin is warm and dry.  Neurological:     General: No focal deficit present.     Mental Status: She is oriented to person, place, and time.     Cranial Nerves: No cranial nerve deficit.     Coordination: Coordination normal.  Psychiatric:        Mood and Affect: Mood normal.     ED Results / Procedures / Treatments   Labs (all labs ordered are listed, but only abnormal results are displayed) Labs Reviewed  COMPREHENSIVE METABOLIC PANEL - Abnormal; Notable for the following components:      Result Value   Sodium 134 (*)    Glucose, Bld 105 (*)    BUN 24 (*)    Creatinine, Ser 1.48 (*)    Calcium 8.1 (*)    Albumin 3.4 (*)    AST 45 (*)    GFR calc non Af Amer 39 (*)    GFR calc Af Amer 45 (*)    All other components within normal limits  D-DIMER, QUANTITATIVE (NOT AT [facility]) - Abnormal; Notable for the following components:   D-Dimer, Quant 0.99 (*)    All other components within normal limits  LACTATE DEHYDROGENASE - Abnormal; Notable for the following components:   LDH 338 (*)    All other components within normal limits  TRIGLYCERIDES - Abnormal; Notable for the following components:   Triglycerides 161 (*)    All other components within normal limits    FIBRINOGEN - Abnormal; Notable for the following components:   Fibrinogen 525 (*)    All other components within normal limits  SARS CORONAVIRUS 2 BY RT PCR (HOSPITAL ORDER, [facility])  CULTURE, BLOOD (ROUTINE X 2)  CULTURE, BLOOD (ROUTINE  X 2)  LACTIC ACID, PLASMA  CBC WITH DIFFERENTIAL/PLATELET  LACTIC ACID, PLASMA  PROCALCITONIN  FERRITIN  C-REACTIVE PROTEIN  I-STAT BETA HCG BLOOD, ED ([facility], [facility], [facility] ONLY)    EKG EKG Interpretation  Date/Time:  Monday May 30 2019 15:45:22 EDT Ventricular Rate:  104 PR Interval:    QRS Duration: 88 QT Interval:  341 QTC Calculation: 449 R Axis:   26 Text Interpretation: Sinus tachycardia Low voltage, precordial leads Baseline wander in lead(s) I aVL V4 V5 tachycardia, otherwise no change from old Confirmed by Arby Barrette [phone]) on 05/30/2019 4:36:08 PM   Radiology DG Chest Port 1 View  Result Date: 05/30/2019 CLINICAL DATA:  COVID positive EXAM: PORTABLE CHEST 1 VIEW COMPARISON:  02/17/2025 FINDINGS: Patchy bibasilar infiltrates consistent with pneumonia. No effusion or heart failure. Heart size within normal limits. IMPRESSION: Bilateral infiltrates compatible with pneumonia Electronically Signed   By: Marlan Palau M.D.   On: 05/30/2019 14:23    Procedures Procedures (including critical care time) CRITICAL CARE Performed by: Arby Barrette   Total critical care time: 30  minutes  Critical care time was exclusive of separately billable procedures and treating other patients.  Critical care was necessary to treat or prevent imminent or life-threatening deterioration.  Critical care was time spent personally by me on the following activities: development of treatment plan with patient and/or surrogate as well as nursing, discussions with consultants, evaluation of patient's response to treatment, examination of patient, obtaining history from patient or surrogate, ordering and performing treatments and  interventions, ordering and review of laboratory studies, ordering and review of radiographic studies, pulse oximetry and re-evaluation of patient's condition. Medications Ordered in ED Medications  HYDROcodone-homatropine (HYCODAN) 5-1.5 MG/5ML syrup 5 mL (5 mLs Oral Given 05/30/19 1452)  albuterol (VENTOLIN HFA) 108 (90 Base) MCG/ACT inhaler 4 puff (4 puffs Inhalation Given 05/30/19 1452)  acetaminophen (TYLENOL) tablet 650 mg (650 mg Oral Given 05/30/19 1452)  ibuprofen (ADVIL) tablet 800 mg (800 mg Oral Given 05/30/19 1452)    ED Course  I have reviewed the triage vital signs and the nursing notes.  Pertinent labs & imaging results that were available during my care of the patient were reviewed by me and considered in my medical decision making (see chart for details).    MDM Rules/Calculators/A&P                     Patient had positive Covid testing a week ago.  Symptoms have progressed to include significant shortness of breath particularly with exertion and severe generalized weakness with persistent fever.  X-ray shows diffuse patchy infiltrate.  Patient's oxygen saturation is 90% on room air.  At this time, plan for admission for Covid pneumonia.  Patient's mental status is clear.  She does have tachypnea but no sign of impending respiratory failure at this time.  Janki Dike was evaluated in Emergency Department on 05/30/2019 for the symptoms described in the history of present illness. She was evaluated in the context of the global COVID-19 pandemic, which necessitated consideration that the patient might be at risk for infection with the SARS-CoV-2 virus that causes COVID-19. Institutional protocols and algorithms that pertain to the evaluation of patients at risk for COVID-19 are in a state of rapid change based on information released by regulatory bodies including the CDC and federal and state organizations. These policies and algorithms were followed during the  patient's care in the ED. Final Clinical Impression(s) / ED Diagnoses Final diagnoses:  Pneumonia due to COVID-19 virus    Rx / DC Orders ED Discharge Orders    None       Arby Barrette, MD 05/30/19 [phone]

## 2019-05-31 DIAGNOSIS — J1282 Pneumonia due to coronavirus disease 2019: Secondary | ICD-10-CM

## 2019-05-31 DIAGNOSIS — N179 Acute kidney failure, unspecified: Secondary | ICD-10-CM

## 2019-05-31 LAB — CBC WITH DIFFERENTIAL/PLATELET
Abs Immature Granulocytes: 0.01 10*3/uL (ref 0.00–0.07)
Basophils Absolute: 0 10*3/uL (ref 0.0–0.1)
Basophils Relative: 0 %
Eosinophils Absolute: 0 10*3/uL (ref 0.0–0.5)
Eosinophils Relative: 0 %
HCT: 37.7 % (ref 36.0–46.0)
Hemoglobin: 12.1 g/dL (ref 12.0–15.0)
Immature Granulocytes: 0 %
Lymphocytes Relative: 33 %
Lymphs Abs: 0.8 10*3/uL (ref 0.7–4.0)
MCH: 26 pg (ref 26.0–34.0)
MCHC: 32.1 g/dL (ref 30.0–36.0)
MCV: 81.1 fL (ref 80.0–100.0)
Monocytes Absolute: 0.1 10*3/uL (ref 0.1–1.0)
Monocytes Relative: 2 %
Neutro Abs: 1.5 10*3/uL — ABNORMAL LOW (ref 1.7–7.7)
Neutrophils Relative %: 65 %
Platelets: 274 10*3/uL (ref 150–400)
RBC: 4.65 MIL/uL (ref 3.87–5.11)
RDW: 15.7 % — ABNORMAL HIGH (ref 11.5–15.5)
WBC: 2.3 10*3/uL — ABNORMAL LOW (ref 4.0–10.5)
nRBC: 0 % (ref 0.0–0.2)

## 2019-05-31 LAB — COMPREHENSIVE METABOLIC PANEL
ALT: 23 U/L (ref 0–44)
AST: 33 U/L (ref 15–41)
Albumin: 3.5 g/dL (ref 3.5–5.0)
Alkaline Phosphatase: 55 U/L (ref 38–126)
Anion gap: 13 (ref 5–15)
BUN: 32 mg/dL — ABNORMAL HIGH (ref 6–20)
CO2: 25 mmol/L (ref 22–32)
Calcium: 8.2 mg/dL — ABNORMAL LOW (ref 8.9–10.3)
Chloride: 96 mmol/L — ABNORMAL LOW (ref 98–111)
Creatinine, Ser: 1.76 mg/dL — ABNORMAL HIGH (ref 0.44–1.00)
GFR calc Af Amer: 37 mL/min — ABNORMAL LOW (ref >60)
GFR calc non Af Amer: 32 mL/min — ABNORMAL LOW (ref >60)
Glucose, Bld: 204 mg/dL — ABNORMAL HIGH (ref 70–99)
Potassium: 4.1 mmol/L (ref 3.5–5.1)
Sodium: 134 mmol/L — ABNORMAL LOW (ref 135–145)
Total Bilirubin: 0.7 mg/dL (ref 0.3–1.2)
Total Protein: 7.7 g/dL (ref 6.5–8.1)

## 2019-05-31 LAB — HIV ANTIBODY (ROUTINE TESTING W REFLEX): HIV Screen 4th Generation wRfx: NONREACTIVE

## 2019-05-31 LAB — C-REACTIVE PROTEIN: CRP: 9.9 mg/dL — ABNORMAL HIGH (ref <1.0)

## 2019-05-31 LAB — FERRITIN: Ferritin: 232 ng/mL (ref 11–307)

## 2019-05-31 MED ORDER — PRO-STAT SUGAR FREE PO LIQD
30.0000 mL | Freq: Two times a day (BID) | ORAL | Status: DC
  Administered 2019-05-31 – 2019-06-01 (×2): 30 mL via ORAL
  Filled 2019-05-31 (×6): qty 30

## 2019-05-31 MED ORDER — ADULT MULTIVITAMIN W/MINERALS CH
1.0000 | ORAL_TABLET | Freq: Every day | ORAL | Status: DC
  Administered 2019-05-31 – 2019-06-03 (×4): 1 via ORAL
  Filled 2019-05-31 (×4): qty 1

## 2019-05-31 MED ORDER — LIP MEDEX EX OINT: TOPICAL_OINTMENT | CUTANEOUS | Status: DC | PRN

## 2019-05-31 MED ORDER — SODIUM CHLORIDE 0.9% FLUSH: 10.0000 mL | INTRAVENOUS | Status: DC | PRN

## 2019-05-31 MED ORDER — HYDROCORTISONE 0.5 % EX CREA
TOPICAL_CREAM | Freq: Two times a day (BID) | CUTANEOUS | Status: DC | PRN
  Filled 2019-05-31: qty 28.35

## 2019-05-31 MED ORDER — LIP MEDEX EX OINT
TOPICAL_OINTMENT | CUTANEOUS | Status: AC
  Filled 2019-05-31: qty 7

## 2019-05-31 MED ORDER — SODIUM CHLORIDE 0.9% FLUSH
10.0000 mL | Freq: Two times a day (BID) | INTRAVENOUS | Status: DC
  Administered 2019-05-31 – 2019-06-03 (×7): 10 mL

## 2019-05-31 NOTE — Progress Notes (Signed)
Initial Nutrition Assessment  RD working remotely.   DOCUMENTATION CODES:   Morbid obesity  INTERVENTION:  - will order Magic Cup BID with meals, each supplement provides 290 kcal and 9 grams of protein. - will order 30 ml prostat BID, each supplement provides 100 kcal and 15 grams protein. - will order 1 tablet multivitamin with minerals/day.  - weigh patient today.   NUTRITION DIAGNOSIS:   Increased nutrient needs related to acute illness, catabolic illness(COVID-19 PNA) as evidenced by estimated needs.  GOAL:   Patient will meet greater than or equal to 90% of their needs  MONITOR:   PO intake, Supplement acceptance, Labs, Weight trends  REASON FOR ASSESSMENT:   Malnutrition Screening Tool  ASSESSMENT:   54 y.o. female with medical history of depression, GERD, HTN, and prediabetes. She was admitted for COVID-19 PNA after presenting to the ED with 9 day hx of itchy eyes, runny nose, dry cough, fever and weakness. She tested positive for COVID-19 on 5/10. Patient also reported losing her sense of smell and sense of taste 1 week PTA.  No intakes documented since admission. Patient has not been weighed at a [facility] facility since 11/22/17 when she weighed 268 lb. The most recently documented weight in the chart is from 03/18/19 at [facility] when she weighed 280 lb.  MST report indicates that patient stated that she has lost 20 lb in the past 1.5 weeks.   Per notes: - COVID-19 PNA - hyponatremia - AKI  Labs reviewed; Na: 134 mmol/l, Cl: 96 mmol/l, BUN: 32 mg/dl, creatinine: 3.47 mg/dl, Ca: 8.2 mg/dl, GFR: 32 ml/min. Medications reviewed; 100 mg colace BID, 100 mg IV remdesivir x2 doses 5/17, 100 mg IV remdesivir x1 dose/day (5/18-5/21).    NUTRITION - FOCUSED PHYSICAL EXAM:  unable to complete at this time.   Diet Order:   Diet Order            Diet regular Room service appropriate? Yes; Fluid consistency: Thin  Diet effective now              EDUCATION  NEEDS:   No education needs have been identified at this time  Skin:  Skin Assessment: Reviewed RN Assessment  Last BM:  5/15  Height:   Ht Readings from Last 1 Encounters:  10/17/13 5\' 2"  (1.575 m)    Weight:   Wt Readings from Last 1 Encounters:  11/22/17 121.7 kg    Ideal Body Weight:     BMI:  There is no height or weight on file to calculate BMI.  Estimated Nutritional Needs:   Kcal:  2000-2200 kcal  Protein:  100-115 grams  Fluid:  >/= 2 L/day     13/10/19, MS, RD, LDN, CNSC Inpatient Clinical Dietitian RD pager # available in [facility]  After hours/weekend pager # available in [facility]

## 2019-05-31 NOTE — Progress Notes (Signed)
PROGRESS NOTE    Erica Odom  CHE:[id number] DOB: 1965/03/12 DOA: 05/30/2019 PCP: Jolinda Croak, MD    Brief Narrative: HPI per Dr. Hollice Gong on 05/30/2019. 54 y.o. female with medical history significant for depression, GERD, hypertension, prediabetes being admitted to the hospital with Covid pneumonia.  Patient tells me that her symptoms started about 9 days ago, when she had some itchy eyes and runny nose, which she attributed to allergies.  2 days later, she had developed a dry cough, was feeling little bit weak so she went to get tested, this is on Monday 5/10.  And she tested positive.  She was quarantining at home, but over the last week she has developed a worsening productive cough, she says she has also had daily fevers, denies any chest pain nausea vomiting, she has also lost taste and smell about a week ago.  Due to feeling continuous weakness, and having fevers with significant cough, she came to her PCP for evaluation, they sent her to the emergency department for further care.  ED Course: In the emergency department, lab work-up revealed that she was saturating 90% on room air so she was placed on 2 L of oxygen.  Chest x-ray shows bilateral changes consistent with viral pneumonia.  Procalcitonin is negative, CRP and ferritin are elevated.  Patient was given a dose of IV Decadron as well as pharmacy was contacted for remdesivir.  Hospitalist was contacted for admission.   Assessment & Plan:   Active Problems:   COVID-19   Hyponatremia   AKI (acute kidney injury) ([place])   Fever  #1 Covid pneumonia patient admitted with shortness of breath and productive cough.  Saturation 90% on room air placed on 2 L of oxygen. Started on remdesivir and Decadron on 05/30/2019. CRP 9.9, procalcitonin less than 0.10 Chest x-ray bilateral infiltrates compatible with pneumonia. Patient tachycardic and tachypneic intermittently.  #2 AKI creatinine elevated likely secondary to  decreased p.o. intake  #3 mild hyperglycemia induced by steroids check hemoglobin A1c  #4 mild hyponatremia likely secondary to decreased p.o. intake and on hydrochlorothiazide at home.  Sodium 134 stable.  Her blood pressure is only 116/83 continue to hold HCTZ.   Nutrition Problem: Increased nutrient needs Etiology: acute illness, catabolic NTIRWER(XVQMG-86 PNA)     Signs/Symptoms: estimated needs    Interventions: Magic cup, Prostat, MVI  Estimated body mass index is 49.09 kg/m as calculated from the following:   Height as of 10/17/13: 5\' 2"  (1.575 m).   Weight as of 11/22/17: 121.7 kg.  DVT prophylaxis: Lovenox Code Status: Full code Family Communication: None Disposition Plan:  Status is: Inpatient  Dispo: The patient is from: Home              Anticipated d/c is to: Home              Anticipated d/c date is: Unknown              Patient currently is not medically stable to d/c.   Consultants: None  Procedures: None Antimicrobials none  Subjective: Resting in bed complaining of a productive cough and profuse sweating, complaints of shortness of breath with exertion.  Objective: Vitals:   05/31/19 0158 05/31/19 0617 05/31/19 0733 05/31/19 0935  BP: 113/61 116/83    Pulse: 61 63 85   Resp: (!) 23 (!) 23    Temp: 97.8 F (36.6 C) 97.8 F (36.6 C)    TempSrc: Oral Oral    SpO2: 93% 96% 95%  93%    Intake/Output Summary (Last 24 hours) at 05/31/2019 1131 Last data filed at 05/31/2019 0932 Gross per 24 hour  Intake 400 ml  Output -  Net 400 ml   There were no vitals filed for this visit.  Examination:  General exam: Appears calm and comfortable  Respiratory system: Scattered rhonchi to auscultation. Respiratory effort normal. Cardiovascular system: S1 & S2 heard, RRR. No JVD, murmurs, rubs, gallops or clicks. No pedal edema. Gastrointestinal system: Abdomen is nondistended, soft and nontender. No organomegaly or masses felt. Normal bowel sounds heard.  Central nervous system: Alert and oriented. No focal neurological deficits. Extremities: Symmetric 5 x 5 power. Skin: No rashes, lesions or ulcers Psychiatry: Judgement and insight appear normal. Mood & affect appropriate.     Data Reviewed: I have personally reviewed following labs and imaging studies  CBC: Recent Labs  Lab 05/30/19 1525 05/31/19 0415  WBC 4.3 2.3*  NEUTROABS 2.7 1.5*  HGB 12.0 12.1  HCT 36.6 37.7  MCV 81.2 81.1  PLT 246 274   Basic Metabolic Panel: Recent Labs  Lab 05/30/19 1525 05/31/19 0415  NA 134* 134*  K 4.3 4.1  CL 98 96*  CO2 23 25  GLUCOSE 105* 204*  BUN 24* 32*  CREATININE 1.48* 1.76*  CALCIUM 8.1* 8.2*   GFR: CrCl cannot be calculated (Unknown ideal weight.). Liver Function Tests: Recent Labs  Lab 05/30/19 1525 05/31/19 0415  AST 45* 33  ALT 22 23  ALKPHOS 46 55  BILITOT 1.1 0.7  PROT 7.3 7.7  ALBUMIN 3.4* 3.5   No results for input(s): LIPASE, AMYLASE in the last 168 hours. No results for input(s): AMMONIA in the last 168 hours. Coagulation Profile: No results for input(s): INR, PROTIME in the last 168 hours. Cardiac Enzymes: No results for input(s): CKTOTAL, CKMB, CKMBINDEX, TROPONINI in the last 168 hours. BNP (last 3 results) No results for input(s): PROBNP in the last 8760 hours. HbA1C: No results for input(s): HGBA1C in the last 72 hours. CBG: No results for input(s): GLUCAP in the last 168 hours. Lipid Profile: Recent Labs    05/30/19 1525  TRIG 161*   Thyroid Function Tests: No results for input(s): TSH, T4TOTAL, FREET4, T3FREE, THYROIDAB in the last 72 hours. Anemia Panel: Recent Labs    05/30/19 1525 05/31/19 0415  FERRITIN 229 232   Sepsis Labs: Recent Labs  Lab 05/30/19 1501 05/30/19 1525  PROCALCITON  --  <0.10  LATICACIDVEN 0.8  --     Recent Results (from the past 240 hour(s))  SARS Coronavirus 2 by RT PCR (hospital order, performed in [facility] hospital lab) Nasopharyngeal  Nasopharyngeal Swab     Status: Abnormal   Collection Time: 05/30/19  2:53 PM   Specimen: Nasopharyngeal Swab  Result Value Ref Range Status   SARS Coronavirus 2 POSITIVE (A) NEGATIVE Final    Comment: RESULT CALLED TO, READ BACK BY AND VERIFIED WITH: THOMPSON,C. RN @1718  05/30/19 BILLINGSLEY,L (NOTE) SARS-CoV-2 target nucleic acids are DETECTED SARS-CoV-2 RNA is generally detectable in upper respiratory specimens  during the acute phase of infection.  Positive results are indicative  of the presence of the identified virus, but do not rule out bacterial infection or co-infection with other pathogens not detected by the test.  Clinical correlation with patient history and  other diagnostic information is necessary to determine patient infection status.  The expected result is negative. Fact Sheet for Patients:   06/01/19  Fact Sheet for Healthcare Providers:   BoilerBrush.com.cy  This test is not yet approved or cleared by the [place] and  has been authorized for detection and/or diagnosis of SARS-CoV-2 by FDA under an Emergency Use Authorization (EUA).  This EUA will remain in effect (meaning this te st can be used) for the duration of  the COVID-19 declaration under Section 564(b)(1) of the Act, 21 U.S.C. section 360-bbb-3(b)(1), unless the authorization is terminated or revoked sooner. Performed at [facility], [address], [place], [place] [address]   Blood Culture (routine x 2)     Status: None (Preliminary result)   Collection Time: 05/30/19  3:25 PM   Specimen: BLOOD LEFT FOREARM  Result Value Ref Range Status   Specimen Description   Final    BLOOD LEFT FOREARM Performed at [facility], [address] [place], [place] [address]    Special Requests   Final    BOTTLES DRAWN AEROBIC AND ANAEROBIC Blood Culture results may not be optimal due to an inadequate volume  of blood received in culture bottles Performed at [facility], [address], [place], [place] [address]    Culture   Final    NO GROWTH < 24 HOURS Performed at [facility], [address], [place], [place] [address]    Report Status PENDING  Incomplete  Blood Culture (routine x 2)     Status: None (Preliminary result)   Collection Time: 05/30/19  3:25 PM   Specimen: BLOOD LEFT HAND  Result Value Ref Range Status   Specimen Description   Final    BLOOD LEFT HAND Performed at [facility], [address], [place], [place] [address]    Special Requests   Final    BOTTLES DRAWN AEROBIC AND ANAEROBIC Blood Culture adequate volume Performed at [facility], [address] [place], [place] [address]    Culture   Final    NO GROWTH < 24 HOURS Performed at [facility], [address], [place], [place] [address]    Report Status PENDING  Incomplete         Radiology Studies: DG Chest Port 1 View  Result Date: 05/30/2019 CLINICAL DATA:  COVID positive EXAM: PORTABLE CHEST 1 VIEW COMPARISON:  02/17/2025 FINDINGS: Patchy bibasilar infiltrates consistent with pneumonia. No effusion or heart failure. Heart size within normal limits. IMPRESSION: Bilateral infiltrates compatible with pneumonia Electronically Signed   By: Marlan Palau M.D.   On: 05/30/2019 14:23        Scheduled Meds: . buPROPion  300 mg Oral q morning - 10a  . dexamethasone (DECADRON) injection  6 mg Intravenous Q24H  . docusate sodium  100 mg Oral BID  . enoxaparin (LOVENOX) injection  60 mg Subcutaneous Q24H  . feeding supplement (PRO-STAT SUGAR FREE 64)  30 mL Oral BID  . fluticasone  1 spray Each Nare Daily  . multivitamin with minerals  1 tablet Oral Daily  . sodium chloride flush  10-40 mL Intracatheter Q12H   Continuous Infusions: . remdesivir 100 mg in NS 100 mL 100 mg (05/31/19 0836)     LOS: 1 day     Alwyn Ren,  MD 05/31/2019, 11:31 AM

## 2019-06-01 DIAGNOSIS — R509 Fever, unspecified: Secondary | ICD-10-CM

## 2019-06-01 DIAGNOSIS — E871 Hypo-osmolality and hyponatremia: Secondary | ICD-10-CM

## 2019-06-01 LAB — CBC WITH DIFFERENTIAL/PLATELET
Abs Immature Granulocytes: 0.05 10*3/uL (ref 0.00–0.07)
Basophils Absolute: 0 10*3/uL (ref 0.0–0.1)
Basophils Relative: 0 %
Eosinophils Absolute: 0 10*3/uL (ref 0.0–0.5)
Eosinophils Relative: 0 %
HCT: 35.4 % — ABNORMAL LOW (ref 36.0–46.0)
Hemoglobin: 11.7 g/dL — ABNORMAL LOW (ref 12.0–15.0)
Immature Granulocytes: 1 %
Lymphocytes Relative: 12 %
Lymphs Abs: 1.2 10*3/uL (ref 0.7–4.0)
MCH: 26.4 pg (ref 26.0–34.0)
MCHC: 33.1 g/dL (ref 30.0–36.0)
MCV: 79.9 fL — ABNORMAL LOW (ref 80.0–100.0)
Monocytes Absolute: 0.5 10*3/uL (ref 0.1–1.0)
Monocytes Relative: 5 %
Neutro Abs: 8.7 10*3/uL — ABNORMAL HIGH (ref 1.7–7.7)
Neutrophils Relative %: 82 %
Platelets: 331 10*3/uL (ref 150–400)
RBC: 4.43 MIL/uL (ref 3.87–5.11)
RDW: 15.4 % (ref 11.5–15.5)
WBC: 10.6 10*3/uL — ABNORMAL HIGH (ref 4.0–10.5)
nRBC: 0 % (ref 0.0–0.2)

## 2019-06-01 LAB — COMPREHENSIVE METABOLIC PANEL
ALT: 26 U/L (ref 0–44)
AST: 28 U/L (ref 15–41)
Albumin: 3.3 g/dL — ABNORMAL LOW (ref 3.5–5.0)
Alkaline Phosphatase: 45 U/L (ref 38–126)
Anion gap: 13 (ref 5–15)
BUN: 39 mg/dL — ABNORMAL HIGH (ref 6–20)
CO2: 25 mmol/L (ref 22–32)
Calcium: 8.9 mg/dL (ref 8.9–10.3)
Chloride: 100 mmol/L (ref 98–111)
Creatinine, Ser: 1.52 mg/dL — ABNORMAL HIGH (ref 0.44–1.00)
GFR calc Af Amer: 44 mL/min — ABNORMAL LOW (ref >60)
GFR calc non Af Amer: 38 mL/min — ABNORMAL LOW (ref >60)
Glucose, Bld: 185 mg/dL — ABNORMAL HIGH (ref 70–99)
Potassium: 4.1 mmol/L (ref 3.5–5.1)
Sodium: 138 mmol/L (ref 135–145)
Total Bilirubin: 0.6 mg/dL (ref 0.3–1.2)
Total Protein: 7.3 g/dL (ref 6.5–8.1)

## 2019-06-01 MED ORDER — PANTOPRAZOLE SODIUM 40 MG PO TBEC
40.0000 mg | DELAYED_RELEASE_TABLET | Freq: Every day | ORAL | Status: DC
  Administered 2019-06-01 – 2019-06-03 (×3): 40 mg via ORAL
  Filled 2019-06-01 (×3): qty 1

## 2019-06-01 MED ORDER — SUCRALFATE 1 GM/10ML PO SUSP
1.0000 g | Freq: Three times a day (TID) | ORAL | Status: DC
  Administered 2019-06-01 – 2019-06-03 (×8): 1 g via ORAL
  Filled 2019-06-01 (×7): qty 10

## 2019-06-01 MED ORDER — HYDROCOD POLST-CPM POLST ER 10-8 MG/5ML PO SUER
5.0000 mL | Freq: Two times a day (BID) | ORAL | Status: DC
  Administered 2019-06-01 – 2019-06-03 (×5): 5 mL via ORAL
  Filled 2019-06-01 (×5): qty 5

## 2019-06-01 NOTE — Progress Notes (Addendum)
PROGRESS NOTE    Erica Odom  AST:[id number] DOB: April 12, 1965 DOA: 05/30/2019 PCP: Stevphen Rochester, MD    Brief Narrative:  Patient admitted to the hospital with a working diagnosis of acute hypoxic respiratory failure due to SARS COVID-19 viral pneumonia.  55 year old female who presented with weakness, and dyspnea.  She has significant past medical history for depression, GERD, hypertension and prediabetes.  Patient tested positive for COVID-19 5/10, her symptoms consistent initially with loss of smell, taste, dry cough, and generalized weakness, over time her symptoms progressed to daily fever, productive cough and dyspnea.  She was seen by her primary care provider who referred her to the hospital for further evaluation. On her initial physical examination her blood pressure was 109/64, heart rate 98, temperature 38.1 C, respiratory 25, oxygen saturation 90% on room air.  Her lungs are clear to auscultation bilaterally, heart S1-S2 present rhythm, soft abdomen, no lower extremity edema. Her chest radiograph had bilateral interstitial infiltrates, predominantly at bases.  EKG 104 bpm, normal axis, normal intervals, sinus rhythm, no ST segment T wave changes.  Patient was placed on supplemental oxygen per nasal cannula, systemic corticosteroids and intravenous remdesivir.   Assessment & Plan:   Active Problems:   Pneumonia due to COVID-19 virus   Hyponatremia   AKI (acute kidney injury) ([facility])   Fever   1. Acute hypoxic respiratory failure due to SARS COVID 19 viral pneumonia.   RR: 18  Pulse oxymetry: 94%  Fi02: 1 L/ min per [place]  (83% on room air)  COVID-19 Labs  Recent Labs    05/30/19 1525 05/31/19 0415  DDIMER 0.99*  --   FERRITIN 229 232  LDH 338*  --   CRP 8.8* 9.9*    Lab Results  Component Value Date   SARSCOV2NAA POSITIVE (A) 05/30/2019    Inflammatory markers improving, but patient continue to have dyspnea on exertion and oxygen  desaturation on room air.   Will continue medical therapy with dexamethasone, and remdesivir #3/5. Continue bronchodilator therapy, airway clearing techniques (fluttefr valve and incentive spirometer), antitussive agents. Out of bed to chair tid with meals and physical therapy evaluation.   2. AKI with hyponatremia. Renal function with serum cr down to 1,52 from 1,76, K at 4,1 and serum bicarbonate at 25. Na at 138.   Will continue to encourage po intake, hold on IV fluids for now, follow on renal panel in am, avoid hypotension and nephrotoxic medications.   4. Steroid induced hyperglycemia. Fasting glucose this am 185, not on insulin therapy.   5. GERD. Will add pantoprazole and sucralfate.   6. Left armpit irritation. Will hold on topical steroids for now. Keep area dry.   7. Depression. Chronic pain. Continue with bupropion. Pain control with hydrocodone.   8. Obesity class 3. BMI more than 40, will need outpatient follow up. Status is: Inpatient  Remains inpatient appropriate because:IV treatments appropriate due to intensity of illness or inability to take PO   Dispo: The patient is from: Home              Anticipated d/c is to: Home              Anticipated d/c date is: 2 days              Patient currently is not medically stable to d/c.   DVT prophylaxis: Enoxaparin   Code Status:   full  Family Communication:  No family at the bedside  Nutrition Status: Nutrition Problem: Increased nutrient needs Etiology: acute illness, catabolic illness(COVID-19 PNA) Signs/Symptoms: estimated needs Interventions: Magic cup, Prostat, MVI       Subjective: Patient continue to have dyspnea on exertion and cough, poor appetite, no nausea or vomiting,   Objective: Vitals:   05/31/19 1332 05/31/19 2052 06/01/19 0532 06/01/19 0821  BP: 92/67 108/70 119/71   Pulse: 79 68 61   Resp: 16 (!) 22 18   Temp: 98.8 F (37.1 C) 97.8 F (36.6 C) 97.9 F (36.6 C)   TempSrc: Oral  Oral Oral   SpO2: 95% 94% 95% 94%  Weight:      Height:        Intake/Output Summary (Last 24 hours) at 06/01/2019 0854 Last data filed at 05/31/2019 1500 Gross per 24 hour  Intake 800 ml  Output --  Net 800 ml   Filed Weights   05/31/19 0933  Weight: 117 kg    Examination:   General: Not in pain or dyspnea, deconditioned  Neurology: Awake and alert, non focal  E ENT: no pallor, no icterus, oral mucosa moist Cardiovascular: No JVD. S1-S2 present, rhythmic, no gallops, rubs, or murmurs. No lower extremity edema. Pulmonary: positive breath sounds bilaterally, no wheezing. Gastrointestinal. Abdomen with no organomegaly, non tender, no rebound or guarding Skin. No rashes Musculoskeletal: no joint deformities     Data Reviewed: I have personally reviewed following labs and imaging studies  CBC: Recent Labs  Lab 05/30/19 1525 05/31/19 0415 06/01/19 0440  WBC 4.3 2.3* 10.6*  NEUTROABS 2.7 1.5* 8.7*  HGB 12.0 12.1 11.7*  HCT 36.6 37.7 35.4*  MCV 81.2 81.1 79.9*  PLT 246 274 331   Basic Metabolic Panel: Recent Labs  Lab 05/30/19 1525 05/31/19 0415 06/01/19 0440  NA 134* 134* 138  K 4.3 4.1 4.1  CL 98 96* 100  CO2 23 25 25   GLUCOSE 105* 204* 185*  BUN 24* 32* 39*  CREATININE 1.48* 1.76* 1.52*  CALCIUM 8.1* 8.2* 8.9   GFR: Estimated Creatinine Clearance: 50.2 mL/min (A) (by C-G formula based on SCr of 1.52 mg/dL (H)). Liver Function Tests: Recent Labs  Lab 05/30/19 1525 05/31/19 0415 06/01/19 0440  AST 45* 33 28  ALT 22 23 26   ALKPHOS 46 55 45  BILITOT 1.1 0.7 0.6  PROT 7.3 7.7 7.3  ALBUMIN 3.4* 3.5 3.3*   No results for input(s): LIPASE, AMYLASE in the last 168 hours. No results for input(s): AMMONIA in the last 168 hours. Coagulation Profile: No results for input(s): INR, PROTIME in the last 168 hours. Cardiac Enzymes: No results for input(s): CKTOTAL, CKMB, CKMBINDEX, TROPONINI in the last 168 hours. BNP (last 3 results) No results for  input(s): PROBNP in the last 8760 hours. HbA1C: No results for input(s): HGBA1C in the last 72 hours. CBG: No results for input(s): GLUCAP in the last 168 hours. Lipid Profile: Recent Labs    05/30/19 1525  TRIG 161*   Thyroid Function Tests: No results for input(s): TSH, T4TOTAL, FREET4, T3FREE, THYROIDAB in the last 72 hours. Anemia Panel: Recent Labs    05/30/19 1525 05/31/19 0415  FERRITIN 229 232      Radiology Studies: I have reviewed all of the imaging during this hospital visit personally     Scheduled Meds: . buPROPion  300 mg Oral q morning - 10a  . dexamethasone (DECADRON) injection  6 mg Intravenous Q24H  . docusate sodium  100 mg Oral BID  . enoxaparin (LOVENOX) injection  60 mg  Subcutaneous Q24H  . feeding supplement (PRO-STAT SUGAR FREE 64)  30 mL Oral BID  . fluticasone  1 spray Each Nare Daily  . multivitamin with minerals  1 tablet Oral Daily  . sodium chloride flush  10-40 mL Intracatheter Q12H   Continuous Infusions: . remdesivir 100 mg in NS 100 mL 100 mg (06/01/19 0816)     LOS: 2 days        Per Beagley Gerome Apley, MD

## 2019-06-01 NOTE — Progress Notes (Signed)
Attempted to titrate pt o2 to room air- o2 sat 83%. Placed back on 1L

## 2019-06-01 NOTE — TOC Progression Note (Signed)
Transition of Care [facility]) - Progression Note    Patient Details  Name: Erica Odom MRN: [id number] Date of Birth: [birth year]  [facility] of [facility]) CM/SW Contact  Geni Bers, RN Phone Number: 06/01/2019, 3:55 PM  Clinical Narrative:    Pt plan to discharge home with no needs at present time.    Expected Discharge Plan: Home/Self Care Barriers to Discharge: No Barriers Identified  Expected Discharge Plan and Services Expected Discharge Plan: Home/Self Care       Living arrangements for the past 2 months: Single Family Home                                       Social Determinants of Health (SDOH) Interventions    Readmission Risk Interventions No flowsheet data found.

## 2019-06-02 LAB — COMPREHENSIVE METABOLIC PANEL
ALT: 24 U/L (ref 0–44)
AST: 25 U/L (ref 15–41)
Albumin: 3.3 g/dL — ABNORMAL LOW (ref 3.5–5.0)
Alkaline Phosphatase: 41 U/L (ref 38–126)
Anion gap: 11 (ref 5–15)
BUN: 32 mg/dL — ABNORMAL HIGH (ref 6–20)
CO2: 25 mmol/L (ref 22–32)
Calcium: 8.6 mg/dL — ABNORMAL LOW (ref 8.9–10.3)
Chloride: 101 mmol/L (ref 98–111)
Creatinine, Ser: 1.33 mg/dL — ABNORMAL HIGH (ref 0.44–1.00)
GFR calc Af Amer: 52 mL/min — ABNORMAL LOW (ref >60)
GFR calc non Af Amer: 45 mL/min — ABNORMAL LOW (ref >60)
Glucose, Bld: 184 mg/dL — ABNORMAL HIGH (ref 70–99)
Potassium: 4.5 mmol/L (ref 3.5–5.1)
Sodium: 137 mmol/L (ref 135–145)
Total Bilirubin: 0.5 mg/dL (ref 0.3–1.2)
Total Protein: 6.7 g/dL (ref 6.5–8.1)

## 2019-06-02 LAB — D-DIMER, QUANTITATIVE: D-Dimer, Quant: 0.7 ug/mL-FEU — ABNORMAL HIGH (ref 0.00–0.50)

## 2019-06-02 LAB — C-REACTIVE PROTEIN: CRP: 2.4 mg/dL — ABNORMAL HIGH (ref <1.0)

## 2019-06-02 LAB — FERRITIN: Ferritin: 235 ng/mL (ref 11–307)

## 2019-06-02 MED ORDER — ENOXAPARIN SODIUM 60 MG/0.6ML ~~LOC~~ SOLN
60.0000 mg | Freq: Every day | SUBCUTANEOUS | Status: DC
  Administered 2019-06-02 – 2019-06-03 (×2): 60 mg via SUBCUTANEOUS
  Filled 2019-06-02 (×2): qty 0.6

## 2019-06-02 NOTE — Evaluation (Signed)
Occupational Therapy Evaluation Patient Details Name: Erica Odom MRN: [id number] DOB: [birth year] Today's Date: 06/02/2019    History of Present Illness 54 year old female who presented with weakness, and dyspnea.  She has significant past medical history for depression, GERD, hypertension and prediabetes.  Patient tested positive for COVID-19 5/10, her symptoms consistent initially with loss of smell, taste, dry cough, and generalized weakness, over time her symptoms progressed to daily fever, productive cough and dyspnea.  Pt admitted for Acute hypoxic respiratory failure due to SARS COVID 19 viral pneumonia   Clinical Impression   Erica Odom is a 54 year old woman admitted to hospital with COVID. On evaluation she presents with good ROM and strength of upper extremities, reports independence with ADLs including toileting and showering while in hospital. She reports decreased endurance compared to normal and being on 1 L [place]. Patient able to perform ADLs and therapist and patient discussed need for shower chair for energy conservation and safety at home. Patient has no further OT needs.     Follow Up Recommendations  No OT follow up    Equipment Recommendations  (shower chair)    Recommendations for Other Services       Precautions / Restrictions        Mobility Bed Mobility Overal bed mobility: Modified Independent                Transfers                 General transfer comment: Patient reports independence with bed mobility and ambulation in room on Ward extension.    Balance                                           ADL either performed or assessed with clinical judgement   ADL                                         General ADL Comments: Patient reports independently ambulating to bathroom and perform toilieting, ambulating in room, and showering without assistance. Patient on 1 liter .      Vision   Vision Assessment?: No apparent visual deficits     Perception     Praxis      Pertinent Vitals/Pain       Hand Dominance     Extremity/Trunk Assessment Upper Extremity Assessment Upper Extremity Assessment: Overall WFL for tasks assessed   Lower Extremity Assessment Lower Extremity Assessment: Defer to PT evaluation   Cervical / Trunk Assessment Cervical / Trunk Assessment: Normal   Communication Communication Communication: No difficulties   Cognition Arousal/Alertness: Awake/alert Behavior During Therapy: WFL for tasks assessed/performed Overall Cognitive Status: Within Functional Limits for tasks assessed                                     General Comments       Exercises     Shoulder Instructions      Home Living Family/patient expects to be discharged to:: Private residence Living Arrangements: Alone           Home Layout: Two level Alternate Level Stairs-Number of Steps: flight Alternate Level Stairs-Rails: Right Bathroom Shower/Tub: Tub/shower unit  Home Equipment: None          Prior Functioning/Environment Level of Independence: Independent                 OT Problem List: Decreased activity tolerance      OT Treatment/Interventions:      OT Goals(Current goals can be found in the care plan section) Acute Rehab OT Goals Patient Stated Goal: to get her endurance back. OT Goal Formulation: All assessment and education complete, DC therapy  OT Frequency:     Barriers to D/C:            Co-evaluation              AM-PAC OT "6 Clicks" Daily Activity     Outcome Measure Help from another person eating meals?: None Help from another person taking care of personal grooming?: None Help from another person toileting, which includes using toliet, bedpan, or urinal?: None Help from another person bathing (including washing, rinsing, drying)?: None Help from another person to put on  and taking off regular upper body clothing?: None Help from another person to put on and taking off regular lower body clothing?: None 6 Click Score: 24   End of Session    Activity Tolerance: Patient limited by fatigue Patient left: in bed;with call bell/phone within reach  OT Visit Diagnosis: Muscle weakness (generalized) (M62.81)                Time: 0998-3382 OT Time Calculation (min): 11 min Charges:  OT General Charges $OT Visit: 1 Visit OT Evaluation $OT Eval Low Complexity: 1 Low  Ambar Raphael, OTR/L Acute Care Rehab Services  Office [phone]   Lenward Chancellor 06/02/2019, 5:10 PM

## 2019-06-02 NOTE — Evaluation (Signed)
Physical Therapy Evaluation Patient Details Name: Erica Odom MRN: [id number] DOB: [birth year] Today's Date: 06/02/2019   History of Present Illness  54 year old female who presented with weakness, and dyspnea.  She has significant past medical history for depression, GERD, hypertension and prediabetes.  Patient tested positive for COVID-19 5/10, her symptoms consistent initially with loss of smell, taste, dry cough, and generalized weakness, over time her symptoms progressed to daily fever, productive cough and dyspnea.  Pt admitted for Acute hypoxic respiratory failure due to SARS COVID 19 viral pneumonia  Clinical Impression  Pt admitted with above diagnosis.  Pt currently with functional limitations due to the deficits listed below (see PT Problem List). Pt will benefit from skilled PT to increase their independence and safety with mobility to allow discharge to the venue listed below.   Pt assisted with ambulating in room and monitored oxygen saturations.  Pt required 3 seated rest breaks due to dyspnea and fatigue.  Pt reports going to gym prior to admission working on losing weight.  Pt would benefit from OP PT upon d/c.  Pt encouraged to continue using incentive spirometer and flutter device.  Pt also encouraged to prone as able.  Pt eager to improve and return to baseline.     Follow Up Recommendations Outpatient PT    Equipment Recommendations  None recommended by PT    Recommendations for Other Services       Precautions / Restrictions Precautions Precautions: Fall Precaution Comments: monitor sats      Mobility  Bed Mobility Overal bed mobility: Modified Independent                Transfers Overall transfer level: Needs assistance Equipment used: None Transfers: Sit to/from Stand Sit to Stand: Supervision         General transfer comment: supervision for lines  Ambulation/Gait Ambulation/Gait assistance: Supervision Gait Distance (Feet):  24 Feet(x3) Assistive device: None Gait Pattern/deviations: Step-through pattern;Decreased stride length     General Gait Details: pt ambulated 24 feet x 3 with seated rest breaks due to dyspnea and fatigue, Spo2 91-93% on 1L O2   Stairs            Wheelchair Mobility    Modified Rankin (Stroke Patients Only)       Balance Overall balance assessment: Mild deficits observed, not formally tested                                           Pertinent Vitals/Pain Pain Assessment: No/denies pain    Home Living Family/patient expects to be discharged to:: Private residence Living Arrangements: Alone           Home Layout: Two level Home Equipment: None      Prior Function Level of Independence: Independent               Hand Dominance        Extremity/Trunk Assessment        Lower Extremity Assessment Lower Extremity Assessment: Overall WFL for tasks assessed    Cervical / Trunk Assessment Cervical / Trunk Assessment: Normal  Communication   Communication: No difficulties  Cognition Arousal/Alertness: Awake/alert Behavior During Therapy: WFL for tasks assessed/performed Overall Cognitive Status: Within Functional Limits for tasks assessed  General Comments      Exercises     Assessment/Plan    PT Assessment Patient needs continued PT services  PT Problem List Decreased activity tolerance;Decreased strength;Decreased mobility;Decreased knowledge of use of DME;Cardiopulmonary status limiting activity       PT Treatment Interventions DME instruction;Therapeutic activities;Gait training;Therapeutic exercise;Patient/family education;Functional mobility training;Balance training;Stair training    PT Goals (Current goals can be found in the Care Plan section)  Acute Rehab PT Goals PT Goal Formulation: With patient Time For Goal Achievement: 06/09/19 Potential to  Achieve Goals: Good    Frequency Min 3X/week   Barriers to discharge        Co-evaluation               AM-PAC PT "6 Clicks" Mobility  Outcome Measure Help needed turning from your back to your side while in a flat bed without using bedrails?: None Help needed moving from lying on your back to sitting on the side of a flat bed without using bedrails?: None Help needed moving to and from a bed to a chair (including a wheelchair)?: A Little Help needed standing up from a chair using your arms (e.g., wheelchair or bedside chair)?: A Little Help needed to walk in hospital room?: A Little Help needed climbing 3-5 steps with a railing? : A Little 6 Click Score: 20    End of Session Equipment Utilized During Treatment: Oxygen Activity Tolerance: Patient tolerated treatment well Patient left: in bed;with call bell/phone within reach   PT Visit Diagnosis: Difficulty in walking, not elsewhere classified (R26.2)    Time: 1019-1040 PT Time Calculation (min) (ACUTE ONLY): 21 min   Charges:   PT Evaluation $PT Eval Low Complexity: 1 Low        Kati PT, DPT Acute Rehabilitation Services Office: [phone]   Trena Platt 06/02/2019, 12:54 PM

## 2019-06-02 NOTE — Progress Notes (Signed)
PROGRESS NOTE    Khamryn Calderone  FXT:[id number] DOB: 04/24/1965 DOA: 05/30/2019 PCP: Jolinda Croak, MD    Brief Narrative:  Patient admitted to the hospital with a working diagnosis of acute hypoxic respiratory failure due to SARS COVID-19 viral pneumonia.  54 year old female who presented with weakness, and dyspnea.  She has significant past medical history for depression, GERD, hypertension and prediabetes.  Patient tested positive for COVID-19 5/10, her symptoms consistent initially with loss of smell, taste, dry cough, and generalized weakness, over time her symptoms progressed to daily fever, productive cough and dyspnea.  She was seen by her primary care provider who referred her to the hospital for further evaluation. On her initial physical examination her blood pressure was 109/64, heart rate 98, temperature 38.1 C, respiratory 25, oxygen saturation 90% on room air.  Her lungs are clear to auscultation bilaterally, heart S1-S2 present rhythm, soft abdomen, no lower extremity edema. Her chest radiograph had bilateral interstitial infiltrates, predominantly at bases.  EKG 104 bpm, normal axis, normal intervals, sinus rhythm, no ST segment T wave changes.  Patient was placed on supplemental oxygen per nasal cannula, systemic corticosteroids and intravenous remdesivir.  Patient has been responding well to medical therapy.    Assessment & Plan:   Active Problems:   Pneumonia due to COVID-19 virus   Hyponatremia   AKI (acute kidney injury) ([place])   Fever  1. Acute hypoxic respiratory failure due to SARS COVID 19 viral pneumonia.   RR: 15  Pulse oxymetry: 95%  Fi02: 1.5 L/ min per [place]  COVID-19 Labs  Recent Labs    05/30/19 1525 05/31/19 0415 06/02/19 0430  DDIMER 0.99*  --  0.70*  FERRITIN 229 232 235  LDH 338*  --   --   CRP 8.8* 9.9* 2.4*    Lab Results  Component Value Date   SARSCOV2NAA POSITIVE (A) 05/30/2019     Continue inflammatory  markers improving, she continue to have dyspnea on exertion.   Continue medical therapy with dexamethasone, and remdesivir #4/5. On bronchodilator therapy, airway clearing techniques (fluttefr valve and incentive spirometer), continue with antitussive agents.   Today will get physical therapy evaluation in preparation for dc home in am.   2. AKI with hyponatremia. Her renal function with serum cr trendinf down to 1,33, K at 4,5 and serum bicarbonate at 25.   Improved oral intake, continue to hold on IV fluids, will follow up on renal panel in am.   4. Steroid induced hyperglycemia. Stable glucose with, fasting glucose this am 184.   5. GERD. Improved GERD and dyspepsia symptoms with pantoprazole and sucralfate. Improved po intake, with no nausea or vomiting.  6. Left armpit irritation. Irritation has improved, keep area dry.  7. Depression. Chronic pain. On bupropion. Continue pain control with hydrocodone.   8. Obesity class 3. BMI more than 40, follow as outpatient.   Status is: Inpatient  Remains inpatient appropriate because:IV treatments appropriate due to intensity of illness or inability to take PO   Dispo: The patient is from: Home              Anticipated d/c is to: Home              Anticipated d/c date is: 1 day              Patient currently is not medically stable to d/c.   DVT prophylaxis:  enoxaparin   Code Status:   full  Family Communication:  No  family at the bedside      Nutrition Status: Nutrition Problem: Increased nutrient needs Etiology: acute illness, catabolic illness(COVID-19 PNA) Signs/Symptoms: estimated needs Interventions: Magic cup, Prostat, MVI      Subjective: Patient is feeling better, dyspnea is improving but not yet back to baseline, dyspepsia has improved with no nausea or vomiting,.   Objective: Vitals:   06/01/19 1000 06/01/19 1356 06/01/19 2046 06/02/19 0530  BP:  115/74 115/64 109/67  Pulse:  62 71 77  Resp:  18 15  20   Temp:  98.9 F (37.2 C) 99.4 F (37.4 C) 97.9 F (36.6 C)  TempSrc:  Oral Oral Oral  SpO2: 93% 90% 95%   Weight:      Height:        Intake/Output Summary (Last 24 hours) at 06/02/2019 0900 Last data filed at 06/02/2019 0456 Gross per 24 hour  Intake 960 ml  Output --  Net 960 ml   Filed Weights   05/31/19 0933  Weight: 117 kg    Examination:   General: Not in pain or dyspnea, deconditioned  Neurology: Awake and alert, non focal  E ENT: no pallor, no icterus, oral mucosa moist Cardiovascular: No JVD. S1-S2 present, rhythmic, no gallops, rubs, or murmurs. No lower extremity edema. Pulmonary: vesicular breath sounds bilaterally; Gastrointestinal. Abdomen soft with, no organomegaly, non tender, no rebound or guarding Skin. No rashes Musculoskeletal: no joint deformities     Data Reviewed: I have personally reviewed following labs and imaging studies  CBC: Recent Labs  Lab 05/30/19 1525 05/31/19 0415 06/01/19 0440  WBC 4.3 2.3* 10.6*  NEUTROABS 2.7 1.5* 8.7*  HGB 12.0 12.1 11.7*  HCT 36.6 37.7 35.4*  MCV 81.2 81.1 79.9*  PLT 246 274 331   Basic Metabolic Panel: Recent Labs  Lab 05/30/19 1525 05/31/19 0415 06/01/19 0440 06/02/19 0430  NA 134* 134* 138 137  K 4.3 4.1 4.1 4.5  CL 98 96* 100 101  CO2 23 25 25 25   GLUCOSE 105* 204* 185* 184*  BUN 24* 32* 39* 32*  CREATININE 1.48* 1.76* 1.52* 1.33*  CALCIUM 8.1* 8.2* 8.9 8.6*   GFR: Estimated Creatinine Clearance: 57.3 mL/min (A) (by C-G formula based on SCr of 1.33 mg/dL (H)). Liver Function Tests: Recent Labs  Lab 05/30/19 1525 05/31/19 0415 06/01/19 0440 06/02/19 0430  AST 45* 33 28 25  ALT 22 23 26 24   ALKPHOS 46 55 45 41  BILITOT 1.1 0.7 0.6 0.5  PROT 7.3 7.7 7.3 6.7  ALBUMIN 3.4* 3.5 3.3* 3.3*   No results for input(s): LIPASE, AMYLASE in the last 168 hours. No results for input(s): AMMONIA in the last 168 hours. Coagulation Profile: No results for input(s): INR, PROTIME in the last  168 hours. Cardiac Enzymes: No results for input(s): CKTOTAL, CKMB, CKMBINDEX, TROPONINI in the last 168 hours. BNP (last 3 results) No results for input(s): PROBNP in the last 8760 hours. HbA1C: No results for input(s): HGBA1C in the last 72 hours. CBG: No results for input(s): GLUCAP in the last 168 hours. Lipid Profile: Recent Labs    05/30/19 1525  TRIG 161*   Thyroid Function Tests: No results for input(s): TSH, T4TOTAL, FREET4, T3FREE, THYROIDAB in the last 72 hours. Anemia Panel: Recent Labs    05/31/19 0415 06/02/19 0430  FERRITIN 232 235      Radiology Studies: I have reviewed all of the imaging during this hospital visit personally     Scheduled Meds: . buPROPion  300 mg Oral q  morning - 10a  . chlorpheniramine-HYDROcodone  5 mL Oral Q12H  . dexamethasone (DECADRON) injection  6 mg Intravenous Q24H  . docusate sodium  100 mg Oral BID  . feeding supplement (PRO-STAT SUGAR FREE 64)  30 mL Oral BID  . fluticasone  1 spray Each Nare Daily  . multivitamin with minerals  1 tablet Oral Daily  . pantoprazole  40 mg Oral Daily  . sodium chloride flush  10-40 mL Intracatheter Q12H  . sucralfate  1 g Oral TID WC & HS   Continuous Infusions: . remdesivir 100 mg in NS 100 mL 100 mg (06/01/19 0816)     LOS: 3 days        Latisia Hilaire Annett Gula, MD

## 2019-06-03 DIAGNOSIS — E669 Obesity, unspecified: Secondary | ICD-10-CM

## 2019-06-03 DIAGNOSIS — R7303 Prediabetes: Secondary | ICD-10-CM

## 2019-06-03 DIAGNOSIS — J9601 Acute respiratory failure with hypoxia: Secondary | ICD-10-CM

## 2019-06-03 LAB — COMPREHENSIVE METABOLIC PANEL
ALT: 26 U/L (ref 0–44)
AST: 23 U/L (ref 15–41)
Albumin: 3.3 g/dL — ABNORMAL LOW (ref 3.5–5.0)
Alkaline Phosphatase: 45 U/L (ref 38–126)
Anion gap: 4 — ABNORMAL LOW (ref 5–15)
BUN: 26 mg/dL — ABNORMAL HIGH (ref 6–20)
CO2: 28 mmol/L (ref 22–32)
Calcium: 8.3 mg/dL — ABNORMAL LOW (ref 8.9–10.3)
Chloride: 103 mmol/L (ref 98–111)
Creatinine, Ser: 1.06 mg/dL — ABNORMAL HIGH (ref 0.44–1.00)
GFR calc Af Amer: >60 mL/min (ref >60)
GFR calc non Af Amer: 59 mL/min — ABNORMAL LOW (ref >60)
Glucose, Bld: 190 mg/dL — ABNORMAL HIGH (ref 70–99)
Potassium: 4.6 mmol/L (ref 3.5–5.1)
Sodium: 135 mmol/L (ref 135–145)
Total Bilirubin: 0.4 mg/dL (ref 0.3–1.2)
Total Protein: 6.9 g/dL (ref 6.5–8.1)

## 2019-06-03 LAB — C-REACTIVE PROTEIN: CRP: 1.5 mg/dL — ABNORMAL HIGH (ref <1.0)

## 2019-06-03 LAB — FERRITIN: Ferritin: 207 ng/mL (ref 11–307)

## 2019-06-03 LAB — D-DIMER, QUANTITATIVE: D-Dimer, Quant: 0.92 ug/mL-FEU — ABNORMAL HIGH (ref 0.00–0.50)

## 2019-06-03 MED ORDER — ALBUTEROL SULFATE HFA 108 (90 BASE) MCG/ACT IN AERS: 4.0000 | INHALATION_SPRAY | RESPIRATORY_TRACT | 0 refills | Status: AC | PRN

## 2019-06-03 MED ORDER — ALBUTEROL SULFATE HFA 108 (90 BASE) MCG/ACT IN AERS: 4.0000 | INHALATION_SPRAY | RESPIRATORY_TRACT | 0 refills | Status: DC | PRN

## 2019-06-03 MED ORDER — PANTOPRAZOLE SODIUM 40 MG PO TBEC: 40.0000 mg | DELAYED_RELEASE_TABLET | Freq: Every day | ORAL | 0 refills | Status: DC

## 2019-06-03 MED ORDER — GUAIFENESIN-DM 100-10 MG/5ML PO SYRP: 5.0000 mL | ORAL_SOLUTION | Freq: Four times a day (QID) | ORAL | 0 refills | Status: DC | PRN

## 2019-06-03 MED ORDER — GUAIFENESIN-DM 100-10 MG/5ML PO SYRP: 5.0000 mL | ORAL_SOLUTION | ORAL | Status: DC | PRN

## 2019-06-03 MED ORDER — GUAIFENESIN-DM 100-10 MG/5ML PO SYRP: 5.0000 mL | ORAL_SOLUTION | Freq: Four times a day (QID) | ORAL | 0 refills | Status: AC | PRN

## 2019-06-03 MED ORDER — PANTOPRAZOLE SODIUM 40 MG PO TBEC: 40.0000 mg | DELAYED_RELEASE_TABLET | Freq: Every day | ORAL | 0 refills | Status: AC

## 2019-06-03 NOTE — Progress Notes (Signed)
SATURATION QUALIFICATIONS: (This note is used to comply with regulatory documentation for home oxygen  Patient Saturations on Room Air at Rest = 90%  Patient Saturations on Room Air while Ambulating = 88%  Patient Saturations on 1.5 Liters of oxygen while Ambulating = 94%  Please briefly explain why patient needs home oxygen: Pt desaturating with ambulation on room air

## 2019-06-03 NOTE — Discharge Summary (Addendum)
Physician Discharge Summary  Erica LeanJacqueline Alexandria Odom WUJ:[id number] DOB: 1965-09-02 DOA: 05/30/2019  PCP: Stevphen RochesterManfredi, Brenda L, MD  Admit date: 05/30/2019 Discharge date: 06/03/2019  Admitted From: Home  Disposition:  Home   Recommendations for Outpatient Follow-up and new medication changes:  1. Follow up with Dr. Donzetta KohutManfredi in 14 days.  2. Continue self quarantine for 2 weeks, use a mask in public and maintain physical distancing.  3. Added pantoprazole for GERD.  4. Continue as needed albuterol and antitussive agents.   Home Health: yes   Equipment/Devices: home 02    Discharge Condition: stable  CODE STATUS: full  Diet recommendation: heart healthy   Brief/Interim Summary: Patientadmitted to the hospital with a working diagnosis of acute hypoxic respiratory failure due to SARS COVID-19 viral pneumonia.  54 year old female who presented with weakness, and dyspnea. She has significant past medical history for depression, GERD, hypertension and prediabetes. Patient tested positive for COVID-19 5/10, her symptoms consistent initially with loss of smell, taste,dry cough, andgeneralized weakness,over timeher symptomsprogressed to daily fever, productive cough and dyspnea. She was seen by her primary care provider who referred her to the hospital for further evaluation. On her initial physical examination her blood pressure was 109/64, heart rate 98, temperature 38.1 C, respiratory rate 25, oxygen saturation 90% on room air. Her lungs were clear to auscultation bilaterally, heart S1-S2 present rhythmic, soft abdomen, no lower extremity edema. Sodium 134, potassium 4.3, chloride 98, bicarb 23, glucose 105, BUN 24, creatinine 1.48, white count 4.3, hemoglobin 12.0, hematocrit 36.6, platelets 246.  SARS COVID-19 positive Her chest radiograph had bilateral interstitial infiltrates, predominantly at bases. EKG 104 bpm, normal axis, normal intervals, sinus rhythm, no ST segment T wave  changes.  Patient was placed on supplemental oxygen per nasal cannula, systemic corticosteroids and intravenous remdesivir.  Patient has been responding well to medical therapy.   1.  Acute hypoxic respiratory failure due to SARS COVID-19 viral pneumonia. Patient was admitted to the medical ward, she received supportive medical therapy including supplemental oxygen per nasal cannula, intravenous dexamethasone and remdesivir.  She was treated with antitussive agents, bronchodilators, and airway clearing techniques with incentive spirometer and flutter valve.  Patient's oxygen requirements and inflammatory markers improved.  COVID-19 Labs  Recent Labs    06/02/19 0430 06/03/19 0415  DDIMER 0.70* 0.92*  FERRITIN 235 207  CRP 2.4* 1.5*    Lab Results  Component Value Date   SARSCOV2NAA POSITIVE (A) 05/30/2019   Patient had an amatory oximetry on room air before discharge, with positive desaturation. She will have home 02 prescribed.   2.  Acute kidney injury with hyponatremia.  Patient received cautious intravenous fluids, and supportive medical care, her kidney function and electrolytes improved, by the time of her discharge her sodium was 135, potassium 4.6, chloride 103, bicarb 28, glucose 190, BUN 26, creatinine 1.0.  3.  Prediabetes with steroid-induced hyperglycemia.  Patient's glucose was monitored closely, at the time of discharge steroids will be discontinued.  4.  GERD.  Patient had an exacerbation of GERD symptoms, she received pantoprazole and sucralfate with good toleration.  5.  Left armpit irritation.  No signs of cellulitis, continue local care, keep area dry.  6.  Depression/chronic pain.  Patient will continue bupropion and hydrocodone.  7.  Obesity class III OSA.  BMI greater than 40, patient will need outpatient follow-up. Has increased 02 at night due to OSA, not using Cpap in the hospital due to COVID.    Discharge Diagnoses:  Principal Problem:  Pneumonia due to COVID-19 virus Active Problems:   Hyponatremia   AKI (acute kidney injury) ([place])   Acute hypoxemic respiratory failure (HCC)   Class 3 obesity   Prediabetes    Discharge Instructions   Allergies as of 06/03/2019      Reactions   Oxycodone-acetaminophen Itching   Tylox   Erythromycin    Cant remember      Medication List    STOP taking these medications   meclizine 25 MG tablet Commonly known as: ANTIVERT   meloxicam 7.5 MG tablet Commonly known as: Mobic   methocarbamol 500 MG tablet Commonly known as: ROBAXIN     TAKE these medications   acetaminophen 325 MG tablet Commonly known as: TYLENOL Take 650 mg by mouth every 6 (six) hours as needed for mild pain, fever or headache.   albuterol 108 (90 Base) MCG/ACT inhaler Commonly known as: VENTOLIN HFA Inhale 4 puffs into the lungs every 4 (four) hours as needed for wheezing or shortness of breath.   buPROPion 300 MG 24 hr tablet Commonly known as: WELLBUTRIN XL Take 300 mg by mouth every morning.   chlorthalidone 25 MG tablet Commonly known as: HYGROTON Take 25 mg by mouth daily.   fluticasone 50 MCG/ACT nasal spray Commonly known as: FLONASE Place 1 spray into both nostrils daily.   guaiFENesin-dextromethorphan 100-10 MG/5ML syrup Commonly known as: ROBITUSSIN DM Take 5 mLs by mouth every 6 (six) hours as needed for cough.   multivitamin capsule Take 1 capsule by mouth daily.   pantoprazole 40 MG tablet Commonly known as: PROTONIX Take 1 tablet (40 mg total) by mouth daily. Start taking on: Jun 04, 2019   VITAMIN C PO Take 1 tablet by mouth daily.   VITAMIN D PO Take 1 tablet by mouth daily.            Durable Medical Equipment  (From admission, onward)         Start     Ordered   06/03/19 0000  For home use only DME oxygen    Question Answer Comment  Length of Need 6 Months   Mode or (Route) Nasal cannula   Liters per Minute 2   Frequency Continuous (stationary  and portable oxygen unit needed)   Oxygen conserving device Yes   Oxygen delivery system Gas      06/03/19 1010         Follow-up Information    Jolinda Croak, MD Follow up in 2 week(s).   Specialty: Family Medicine Contact information: [place] [address] [phone]          Allergies  Allergen Reactions  . Oxycodone-Acetaminophen Itching    Tylox  . Erythromycin     Cant remember      Procedures/Studies: DG Chest Port 1 View  Result Date: 05/30/2019 CLINICAL DATA:  COVID positive EXAM: PORTABLE CHEST 1 VIEW COMPARISON:  02/17/2025 FINDINGS: Patchy bibasilar infiltrates consistent with pneumonia. No effusion or heart failure. Heart size within normal limits. IMPRESSION: Bilateral infiltrates compatible with pneumonia Electronically Signed   By: Franchot Gallo M.D.   On: 05/30/2019 14:23        Subjective: Patient is feeling better, dyspnea has improved, no nausea or vomiting, no chest pain. Patient has been out of bed.   Discharge Exam: Vitals:   06/02/19 2059 06/03/19 0453  BP: 128/64 102/66  Pulse: 66 (!) 57  Resp: 16 16  Temp: 99 F (37.2 C) 98  F (36.7 C)  SpO2: 94% 98%   Vitals:   06/02/19 0530 06/02/19 1422 06/02/19 2059 06/03/19 0453  BP: 109/67 (!) 103/45 128/64 102/66  Pulse: 77 65 66 (!) 57  Resp: 20 16 16 16   Temp: 97.9 F (36.6 C) 97.9 F (36.6 C) 99 F (37.2 C) 98 F (36.7 C)  TempSrc: Oral Oral Oral Oral  SpO2:  93% 94% 98%  Weight:      Height:        General: Not in pain or dyspnea  Neurology: Awake and alert, non focal  E ENT: no pallor, no icterus, oral mucosa moist Cardiovascular: No JVD. S1-S2 present, rhythmic, no gallops, rubs, or murmurs. No lower extremity edema. Pulmonary: positive breath sounds bilaterally. Gastrointestinal. Abdomen soft with no organomegaly, non tender, no rebound or guarding Skin. No rashes Musculoskeletal: no joint deformities   The results of  significant diagnostics from this hospitalization (including imaging, microbiology, ancillary and laboratory) are listed below for reference.     Microbiology: Recent Results (from the past 240 hour(s))  SARS Coronavirus 2 by RT PCR (hospital order, performed in [facility] hospital lab) Nasopharyngeal Nasopharyngeal Swab     Status: Abnormal   Collection Time: 05/30/19  2:53 PM   Specimen: Nasopharyngeal Swab  Result Value Ref Range Status   SARS Coronavirus 2 POSITIVE (A) NEGATIVE Final    Comment: RESULT CALLED TO, READ BACK BY AND VERIFIED WITH: THOMPSON,C. RN @1718  05/30/19 BILLINGSLEY,L (NOTE) SARS-CoV-2 target nucleic acids are DETECTED SARS-CoV-2 RNA is generally detectable in upper respiratory specimens  during the acute phase of infection.  Positive results are indicative  of the presence of the identified virus, but do not rule out bacterial infection or co-infection with other pathogens not detected by the test.  Clinical correlation with patient history and  other diagnostic information is necessary to determine patient infection status.  The expected result is negative. Fact Sheet for Patients:    Fact Sheet for Healthcare Providers:   06/01/19   This test is not yet approved or cleared by the [employer] FDA and  has been authorized for detection and/or diagnosis of SARS-CoV-2 by FDA under an Emergency Use Authorization (EUA).  This EUA will remain in effect (meaning this te st can be used) for the duration of  the COVID-19 declaration under Section 564(b)(1) of the Act, 21 U.S.C. section 360-bbb-3(b)(1), unless the authorization is terminated or revoked sooner. Performed at [facility], [address] [place], [place]   Blood Culture (routine x 2)     Status: None (Preliminary result)   Collection Time: 05/30/19  3:25 PM   Specimen: BLOOD LEFT FOREARM   Result Value Ref Range Status   Specimen Description   Final    BLOOD LEFT FOREARM Performed at [facility], [address], [place], [place]    Special Requests   Final    BOTTLES DRAWN AEROBIC AND ANAEROBIC Blood Culture results may not be optimal due to an inadequate volume of blood received in culture bottles Performed at [facility], [address], [place], [place]    Culture   Final    NO GROWTH 4 DAYS Performed at [facility], [address] [place], [address] [place]    Report Status PENDING  Incomplete  Blood Culture (routine x 2)     Status: None (Preliminary result)   Collection Time: 05/30/19  3:25 PM   Specimen:  BLOOD LEFT HAND  Result Value Ref Range Status   Specimen Description   Final    BLOOD LEFT HAND Performed at [facility], [address], [place], [place] [address]    Special Requests   Final    BOTTLES DRAWN AEROBIC AND ANAEROBIC Blood Culture adequate volume Performed at [facility], [address], [place], [place] [address]    Culture   Final    NO GROWTH 4 DAYS Performed at [facility], [address], [place], [place] [address]    Report Status PENDING  Incomplete     Labs: BNP (last 3 results) No results for input(s): BNP in the last 8760 hours. Basic Metabolic Panel: Recent Labs  Lab 05/30/19 1525 05/31/19 0415 06/01/19 0440 06/02/19 0430 06/03/19 0415  NA 134* 134* 138 137 135  K 4.3 4.1 4.1 4.5 4.6  CL 98 96* 100 101 103  CO2 23 25 25 25 28   GLUCOSE 105* 204* 185* 184* 190*  BUN 24* 32* 39* 32* 26*  CREATININE 1.48* 1.76* 1.52* 1.33* 1.06*  CALCIUM 8.1* 8.2* 8.9 8.6* 8.3*   Liver Function Tests: Recent Labs  Lab 05/30/19 1525 05/31/19 0415 06/01/19 0440 06/02/19 0430 06/03/19 0415  AST 45* 33 28 25 23   ALT 22 23 26 24 26   ALKPHOS 46 55 45 41 45  BILITOT 1.1 0.7 0.6 0.5 0.4  PROT 7.3 7.7 7.3 6.7 6.9   ALBUMIN 3.4* 3.5 3.3* 3.3* 3.3*   No results for input(s): LIPASE, AMYLASE in the last 168 hours. No results for input(s): AMMONIA in the last 168 hours. CBC: Recent Labs  Lab 05/30/19 1525 05/31/19 0415 06/01/19 0440  WBC 4.3 2.3* 10.6*  NEUTROABS 2.7 1.5* 8.7*  HGB 12.0 12.1 11.7*  HCT 36.6 37.7 35.4*  MCV 81.2 81.1 79.9*  PLT 246 274 331   Cardiac Enzymes: No results for input(s): CKTOTAL, CKMB, CKMBINDEX, TROPONINI in the last 168 hours. BNP: Invalid input(s): POCBNP CBG: No results for input(s): GLUCAP in the last 168 hours. D-Dimer Recent Labs    06/02/19 0430 06/03/19 0415  DDIMER 0.70* 0.92*   Hgb A1c No results for input(s): HGBA1C in the last 72 hours. Lipid Profile No results for input(s): CHOL, HDL, LDLCALC, TRIG, CHOLHDL, LDLDIRECT in the last 72 hours. Thyroid function studies No results for input(s): TSH, T4TOTAL, T3FREE, THYROIDAB in the last 72 hours.  Invalid input(s): FREET3 Anemia work up Recent Labs    06/02/19 0430 06/03/19 0415  FERRITIN 235 207   Urinalysis    Component Value Date/Time   COLORURINE YELLOW 11/22/2017 1341   APPEARANCEUR CLEAR 11/22/2017 1341   LABSPEC 1.016 11/22/2017 1341   PHURINE 6.0 11/22/2017 1341   GLUCOSEU NEGATIVE 11/22/2017 1341   HGBUR NEGATIVE 11/22/2017 1341   BILIRUBINUR NEGATIVE 11/22/2017 1341   KETONESUR NEGATIVE 11/22/2017 1341   PROTEINUR NEGATIVE 11/22/2017 1341   UROBILINOGEN 0.2 03/26/2007 2030   NITRITE NEGATIVE 11/22/2017 1341   LEUKOCYTESUR NEGATIVE 11/22/2017 1341   Sepsis Labs Invalid input(s): PROCALCITONIN,  WBC,  LACTICIDVEN Microbiology Recent Results (from the past 240 hour(s))  SARS Coronavirus 2 by RT PCR (hospital order, performed in [facility] hospital lab) Nasopharyngeal Nasopharyngeal Swab     Status: Abnormal   Collection Time: 05/30/19  2:53 PM   Specimen: Nasopharyngeal Swab  Result Value Ref Range Status   SARS Coronavirus 2 POSITIVE (A) NEGATIVE Final    Comment:  RESULT CALLED TO, READ BACK BY AND VERIFIED WITH:  THOMPSON,C. RN @1718  05/30/19 BILLINGSLEY,L (NOTE) SARS-CoV-2 target nucleic acids are DETECTED SARS-CoV-2 RNA is generally detectable in upper respiratory specimens  during the acute phase of infection.  Positive results are indicative  of the presence of the identified virus, but do not rule out bacterial infection or co-infection with other pathogens not detected by the test.  Clinical correlation with patient history and  other diagnostic information is necessary to determine patient infection status.  The expected result is negative. Fact Sheet for Patients:   06/01/19  Fact Sheet for Healthcare Providers:   [employer]   This test is not yet approved or cleared by the https://pope.com/ FDA and  has been authorized for detection and/or diagnosis of SARS-CoV-2 by FDA under an Emergency Use Authorization (EUA).  This EUA will remain in effect (meaning this te st can be used) for the duration of  the COVID-19 declaration under Section 564(b)(1) of the Act, 21 U.S.C. section 360-bbb-3(b)(1), unless the authorization is terminated or revoked sooner. Performed at [facility], [address], [place], [place]   Blood Culture (routine x 2)     Status: None (Preliminary result)   Collection Time: 05/30/19  3:25 PM   Specimen: BLOOD LEFT FOREARM  Result Value Ref Range Status   Specimen Description   Final    BLOOD LEFT FOREARM Performed at [facility], [address] [place], [place]    Special Requests   Final    BOTTLES DRAWN AEROBIC AND ANAEROBIC Blood Culture results may not be optimal due to an inadequate volume of blood received in culture bottles Performed at [facility], [address], [place], [place]    Culture   Final    NO GROWTH 4 DAYS Performed at [facility],  [address] [place], [place]    Report Status PENDING  Incomplete  Blood Culture (routine x 2)     Status: None (Preliminary result)   Collection Time: 05/30/19  3:25 PM   Specimen: BLOOD LEFT HAND  Result Value Ref Range Status   Specimen Description   Final    BLOOD LEFT HAND Performed at [facility], [address], [place], [place]    Special Requests   Final    BOTTLES DRAWN AEROBIC AND ANAEROBIC Blood Culture adequate volume Performed at [facility], [address], [place], [place]    Culture   Final    NO GROWTH 4 DAYS Performed at [facility], [address], [place], [place]    Report Status PENDING  Incomplete     Time coordinating discharge: 45 minutes  SIGNED:   [id number], MD  [facility] 06/03/2019, 8:31 AM

## 2019-06-03 NOTE — Progress Notes (Signed)
AVS given to patient and explained at the bedside. Medications and follow up appointments have been explained with pt verbalizing understanding.  

## 2019-06-03 NOTE — TOC Progression Note (Signed)
Transition of Care [facility]) - Progression Note    Patient Details  Name: Erica Odom MRN: [id number] Date of Birth: 1965/03/05  Transition of Care [facility]) CM/SW Contact  Geni Bers, RN Phone Number: 06/03/2019, 12:46 PM  Clinical Narrative:    Waiting for O2 from [employer]. Pt needed to call financial department with [employer].    Expected Discharge Plan: Home/Self Care Barriers to Discharge: No Barriers Identified  Expected Discharge Plan and Services Expected Discharge Plan: Home/Self Care       Living arrangements for the past 2 months: Single Family Home Expected Discharge Date: 06/03/19                                     Social Determinants of Health (SDOH) Interventions    Readmission Risk Interventions No flowsheet data found.

## 2019-06-04 LAB — CULTURE, BLOOD (ROUTINE X 2)
Culture: NO GROWTH
Culture: NO GROWTH
Special Requests: ADEQUATE

## 2019-06-12 ENCOUNTER — Encounter (HOSPITAL_COMMUNITY): Payer: Self-pay

## 2019-06-12 ENCOUNTER — Ambulatory Visit (INDEPENDENT_AMBULATORY_CARE_PROVIDER_SITE_OTHER)
Admission: EM | Admit: 2019-06-12 | Discharge: 2019-06-12 | Disposition: A | Discharge status: Other Acute Inpatient Hospital | Payer: BC Managed Care – PPO | Source: Home / Self Care

## 2019-06-12 ENCOUNTER — Emergency Department (HOSPITAL_COMMUNITY)
Admission: EM | Admit: 2019-06-12 | Discharge: 2019-06-13 | Disposition: A | Discharge status: Home / Self Care | Payer: BC Managed Care – PPO | Attending: Emergency Medicine | Admitting: Emergency Medicine

## 2019-06-12 ENCOUNTER — Emergency Department (HOSPITAL_COMMUNITY): Payer: BC Managed Care – PPO

## 2019-06-12 ENCOUNTER — Other Ambulatory Visit: Payer: Self-pay

## 2019-06-12 DIAGNOSIS — R0602 Shortness of breath: Secondary | ICD-10-CM | POA: Diagnosis not present

## 2019-06-12 DIAGNOSIS — M79602 Pain in left arm: Secondary | ICD-10-CM | POA: Diagnosis not present

## 2019-06-12 DIAGNOSIS — Z87891 Personal history of nicotine dependence: Secondary | ICD-10-CM | POA: Diagnosis not present

## 2019-06-12 DIAGNOSIS — Y69 Unspecified misadventure during surgical and medical care: Secondary | ICD-10-CM | POA: Diagnosis not present

## 2019-06-12 DIAGNOSIS — T82848A Pain from vascular prosthetic devices, implants and grafts, initial encounter: Secondary | ICD-10-CM | POA: Diagnosis not present

## 2019-06-12 DIAGNOSIS — I10 Essential (primary) hypertension: Secondary | ICD-10-CM | POA: Diagnosis not present

## 2019-06-12 LAB — CBC WITH DIFFERENTIAL/PLATELET
Abs Immature Granulocytes: 0.01 10*3/uL (ref 0.00–0.07)
Basophils Absolute: 0.1 10*3/uL (ref 0.0–0.1)
Basophils Relative: 1 %
Eosinophils Absolute: 0.1 10*3/uL (ref 0.0–0.5)
Eosinophils Relative: 2 %
HCT: 33.6 % — ABNORMAL LOW (ref 36.0–46.0)
Hemoglobin: 10.8 g/dL — ABNORMAL LOW (ref 12.0–15.0)
Immature Granulocytes: 0 %
Lymphocytes Relative: 34 %
Lymphs Abs: 2.2 10*3/uL (ref 0.7–4.0)
MCH: 26.5 pg (ref 26.0–34.0)
MCHC: 32.1 g/dL (ref 30.0–36.0)
MCV: 82.4 fL (ref 80.0–100.0)
Monocytes Absolute: 0.6 10*3/uL (ref 0.1–1.0)
Monocytes Relative: 9 %
Neutro Abs: 3.5 10*3/uL (ref 1.7–7.7)
Neutrophils Relative %: 54 %
Platelets: 350 10*3/uL (ref 150–400)
RBC: 4.08 MIL/uL (ref 3.87–5.11)
RDW: 15.9 % — ABNORMAL HIGH (ref 11.5–15.5)
WBC: 6.4 10*3/uL (ref 4.0–10.5)
nRBC: 0 % (ref 0.0–0.2)

## 2019-06-12 LAB — BASIC METABOLIC PANEL
Anion gap: 10 (ref 5–15)
BUN: 13 mg/dL (ref 6–20)
CO2: 24 mmol/L (ref 22–32)
Calcium: 8.9 mg/dL (ref 8.9–10.3)
Chloride: 106 mmol/L (ref 98–111)
Creatinine, Ser: 1.11 mg/dL — ABNORMAL HIGH (ref 0.44–1.00)
GFR calc Af Amer: >60 mL/min (ref >60)
GFR calc non Af Amer: 55 mL/min — ABNORMAL LOW (ref >60)
Glucose, Bld: 92 mg/dL (ref 70–99)
Potassium: 4.3 mmol/L (ref 3.5–5.1)
Sodium: 140 mmol/L (ref 135–145)

## 2019-06-12 LAB — TROPONIN I (HIGH SENSITIVITY)
Troponin I (High Sensitivity): 3 ng/L (ref <18)
Troponin I (High Sensitivity): 3 ng/L (ref <18)

## 2019-06-12 MED ORDER — IOHEXOL 350 MG/ML SOLN
100.0000 mL | Freq: Once | INTRAVENOUS | Status: AC | PRN
  Administered 2019-06-12: 100 mL via INTRAVENOUS

## 2019-06-12 MED ORDER — ENOXAPARIN SODIUM 120 MG/0.8ML ~~LOC~~ SOLN
1.0000 mg/kg | Freq: Once | SUBCUTANEOUS | Status: AC
  Administered 2019-06-13: 110 mg via SUBCUTANEOUS
  Filled 2019-06-12: qty 0.73

## 2019-06-12 MED ORDER — SODIUM CHLORIDE (PF) 0.9 % IJ SOLN
INTRAMUSCULAR | Status: AC
  Filled 2019-06-12: qty 50

## 2019-06-12 MED ORDER — HYDROCODONE-ACETAMINOPHEN 5-325 MG PO TABS
1.0000 | ORAL_TABLET | Freq: Once | ORAL | Status: AC
  Administered 2019-06-12: 1 via ORAL
  Filled 2019-06-12: qty 1

## 2019-06-12 NOTE — Discharge Instructions (Addendum)
Your labwork and imaging were reassuring tonight We have covered you with a dose of Lovenox in the ED tonight Follow instructions for outpatient ultrasound study as scheduled for tomorrow Follow up with your PCP regarding your ED visit today Return to the ED for any worsening symptoms including worsening shortness of breath, coughing up blood, chest pain, passing out, or any other concerning symptoms

## 2019-06-12 NOTE — ED Triage Notes (Signed)
Pt sts left arm pain after recent admission here. Sts they placed an UD guided line in left upper arm after admitted. C/o pain from insertion point radiating to LLE. Redness, and painful to the touch.

## 2019-06-12 NOTE — ED Notes (Signed)
Patient is being discharged from the Urgent Care Center and sent to the Emergency Department via personal vehicle by self. Per provider Dahlia Byes, patient is stable but in need of higher level of care due to possible blood clot. Patient is aware and verbalizes understanding of plan of care.    Vitals:   06/12/19 1826  BP: (!) 140/91  Pulse: 74  Resp: 20  Temp: 98.7 F (37.1 C)  SpO2: 100%

## 2019-06-12 NOTE — Discharge Instructions (Addendum)
Please go to the ER for further evaluation and ultrasound to rule out blood clot.

## 2019-06-12 NOTE — ED Notes (Signed)
X-ray at bedside

## 2019-06-12 NOTE — ED Provider Notes (Signed)
[facility]-EMERGENCY DEPT Provider Note   CSN: [id number] Arrival date & time: 06/12/19  1851     History Chief Complaint  Patient presents with  . Arm Pain    Erica Odom is a 54 y.o. female with PMHx HTN, GERD, anxiety who presents to the ED today with complaint of L arm pain s/2 IV placement during recent hospitalization.  Chart review patient was admitted to the hospital on 5/17 for acute respiratory failure s/2 Covid pneumonia and discharged on 05/21. Pt has a peripheral line to the left upper arm and reports having it removed on time of discharge. She states that since then she has had pain to her arm. She also complains of swelling to the arm gradually over time. She has been taking OTC pain medication without relief. Pt states that she went to an urgent care today who sent here to the ED for further evaluation with concern for possible blood clot to upper arm.   Pt was discharged on 2L O2. She reports that since being discharge she has felt increasing more short of breath with exertion. She also describes a "funny feeling" in her right chest. She has not had to increase her O2. Denies fevers or chills or specific chest pain.   The history is provided by the patient and medical records.       Past Medical History:  Diagnosis Date  . Alopecia   . Anxiety   . Baker's cyst of knee   . GERD (gastroesophageal reflux disease)   . Hypertension   . Prediabetes   . Sleep apnea     Patient Active Problem List   Diagnosis Date Noted  . Acute hypoxemic respiratory failure (HCC) 06/03/2019  . Class 3 obesity 06/03/2019  . Prediabetes 06/03/2019  . Pneumonia due to COVID-19 virus 05/30/2019  . Hyponatremia 05/30/2019  . AKI (acute kidney injury) (HCC) 05/30/2019  . Fever 05/30/2019    Past Surgical History:  Procedure Laterality Date  . APPENDECTOMY    . HEMORRHOID SURGERY    . KNEE SURGERY       OB History   No obstetric history on  file.     Family History  Problem Relation Age of Onset  . Hypertension Mother   . Diabetes Mother   . Hypercholesterolemia Mother   . Cancer Father   . Hypertension Sister   . Diabetes Sister     Social History   Tobacco Use  . Smoking status: Former Games developer  . Smokeless tobacco: Never Used  Substance Use Topics  . Alcohol use: Yes    Comment: occasionally  . Drug use: Never    Home Medications Prior to Admission medications   Medication Sig Start Date End Date Taking? Authorizing Provider  acetaminophen (TYLENOL) 325 MG tablet Take 650 mg by mouth every 6 (six) hours as needed for mild pain, fever or headache.   Yes [provider]  albuterol (VENTOLIN HFA) 108 (90 Base) MCG/ACT inhaler Inhale 4 puffs into the lungs every 4 (four) hours as needed for wheezing or shortness of breath. 06/03/19  Yes Arrien, York Ram, MD  Ascorbic Acid (VITAMIN C) 1000 MG tablet Take 1,000 mg by mouth daily.   Yes [provider]  fluticasone (FLONASE) 50 MCG/ACT nasal spray Place 1 spray into both nostrils daily as needed for allergies or rhinitis.  05/16/19  Yes [provider]  guaiFENesin-dextromethorphan (ROBITUSSIN DM) 100-10 MG/5ML syrup Take 5 mLs by mouth every 6 (six) hours  as needed for cough. 06/03/19  Yes Arrien, York Ram, MD  Multiple Vitamin (MULTIVITAMIN) capsule Take 1 capsule by mouth daily.   Yes [provider]  pantoprazole (PROTONIX) 40 MG tablet Take 1 tablet (40 mg total) by mouth daily. 06/04/19 07/04/19 Yes Arrien, York Ram, MD  VITAMIN D PO Take 1 tablet by mouth daily.    Yes [provider]    Allergies    Oxycodone-acetaminophen and Erythromycin  Review of Systems   Review of Systems  Constitutional: Negative for chills and fever.  Respiratory: Positive for shortness of breath.   Cardiovascular: Negative for chest pain.  Musculoskeletal: Positive for arthralgias and joint swelling.  All other systems  reviewed and are negative.   Physical Exam Updated Vital Signs BP 129/69   Pulse 76   Temp 99.1 F (37.3 C)   Resp 17   Ht 5\' 2"  (1.575 m)   Wt 111.7 kg   SpO2 100%   BMI 45.03 kg/m   Physical Exam Vitals and nursing note reviewed.  Constitutional:      Appearance: She is not ill-appearing or diaphoretic.  HENT:     Head: Normocephalic and atraumatic.  Eyes:     Conjunctiva/sclera: Conjunctivae normal.  Cardiovascular:     Rate and Rhythm: Normal rate and regular rhythm.     Pulses: Normal pulses.  Pulmonary:     Effort: Pulmonary effort is normal.     Breath sounds: Normal breath sounds. No wheezing, rhonchi or rales.  Abdominal:     Palpations: Abdomen is soft.     Tenderness: There is no abdominal tenderness. There is no guarding or rebound.  Musculoskeletal:     Cervical back: Neck supple.     Comments: LUE slightly more swollen proximally compared to RUE. Tenderness to palpation along medial aspect of upper arm and forearm. Small area of redness to the forearm medially with TTP as well. 2+ radial pulse.   Skin:    General: Skin is warm and dry.  Neurological:     Mental Status: She is alert.     ED Results / Procedures / Treatments   Labs (all labs ordered are listed, but only abnormal results are displayed) Labs Reviewed  BASIC METABOLIC PANEL - Abnormal; Notable for the following components:      Result Value   Creatinine, Ser 1.11 (*)    GFR calc non Af Amer 55 (*)    All other components within normal limits  CBC WITH DIFFERENTIAL/PLATELET - Abnormal; Notable for the following components:   Hemoglobin 10.8 (*)    HCT 33.6 (*)    RDW 15.9 (*)    All other components within normal limits  TROPONIN I (HIGH SENSITIVITY)  TROPONIN I (HIGH SENSITIVITY)    EKG None  Radiology CT Angio Chest PE W/Cm &/Or Wo Cm  Result Date: 06/12/2019 CLINICAL DATA:  Pain in the arm after line placement redness painful to touch EXAM: CT ANGIOGRAPHY CHEST WITH  CONTRAST TECHNIQUE: Multidetector CT imaging of the chest was performed using the standard protocol during bolus administration of intravenous contrast. Multiplanar CT image reconstructions and MIPs were obtained to evaluate the vascular anatomy. CONTRAST:  06/14/2019 OMNIPAQUE IOHEXOL 350 MG/ML SOLN COMPARISON:  None. FINDINGS: Cardiovascular: There is a optimal opacification of the pulmonary arteries. There is no central,segmental, or subsegmental filling defects within the pulmonary arteries. The heart is normal in size. No pericardial effusion or thickening. No evidence right heart strain. There is normal three-vessel brachiocephalic anatomy without  proximal stenosis. The thoracic aorta is normal in appearance. Mediastinum/Nodes: There is a mildly prominent right hilar lymph node measuring 1.2 cm. No axillary adenopathy is noted. Thyroid gland, trachea, and esophagus demonstrate no significant findings. Lungs/Pleura: Multifocal peripherally based patchy ground-glass opacities are seen throughout both lungs, predominantly at the lung bases. No pleural effusion or pneumothorax. Upper Abdomen: No acute abnormalities present in the visualized portions of the upper abdomen. Musculoskeletal: No chest wall abnormality. No acute or significant osseous findings. Review of the MIP images confirms the above findings. IMPRESSION: No central, segmental, or subsegmental pulmonary embolism. Bilateral patchy ground-glass opacities throughout both lungs which likely consistent with multifocal pneumonia. Electronically Signed   By: Prudencio Pair M.D.   On: 06/12/2019 23:39   DG Chest Port 1 View  Result Date: 06/12/2019 CLINICAL DATA:  Pt sts left arm pain after recent admission here. Sts they placed an UD guided line in left upper arm after admitted. C/o pain from insertion point radiating to LLE. Redness, and painful to the touch. EXAM: PORTABLE CHEST 1 VIEW COMPARISON:  Chest radiograph 05/30/2019 FINDINGS: Stable  cardiomediastinal contours. There are mild residual bibasilar opacities, likely atelectatic change or scarring from previous infection. No pneumothorax or significant pleural effusion. No acute finding in the visualized skeleton. IMPRESSION: Mild residual bibasilar opacities, likely atelectatic change or scarring from previous infection. Electronically Signed   By: Audie Pinto M.D.   On: 06/12/2019 20:28    Procedures Procedures (including critical care time)  Medications Ordered in ED Medications  sodium chloride (PF) 0.9 % injection (has no administration in time range)  enoxaparin (LOVENOX) injection 110 mg (has no administration in time range)  HYDROcodone-acetaminophen (NORCO/VICODIN) 5-325 MG per tablet 1 tablet (1 tablet Oral Given 06/12/19 2237)  iohexol (OMNIPAQUE) 350 MG/ML injection 100 mL (100 mLs Intravenous Contrast Given 06/12/19 2313)    ED Course  I have reviewed the triage vital signs and the nursing notes.  Pertinent labs & imaging results that were available during my care of the patient were reviewed by me and considered in my medical decision making (see chart for details).    MDM Rules/Calculators/A&P                      54 year old female who presents to the ED today initially with complaint of left upper arm pain secondary to being discharged from the hospital for acute respiratory failure from Covid pneumonia on 5/21.  She had a peripheral line placed in her left upper arm which was removed and she has been having pain since then.  States it is gradually gotten more swollen and painful.  She was seen by an urgent care today and sent here for further evaluation.  Unfortunately patient came to the ED after ultrasound tech and left for the night.  While in the room she does endorse that she has felt more short of breath recently.  She is currently on 2 L O2 at home secondary to her recent hospital discharge.  She has not had to increase it.  She describes a funny  feeling in her chest however denies active chest pain.  On arrival to the ED patient is afebrile, nontachycardic and nontachypneic.  She does not appear to be working hard to breathe however she did recently have Covid 3 weeks ago and was recently admitted to the hospital, there is concern for possible PE.  Will obtain CTA at this time.  Obtain labs as well.  If CTA  positive for PE patient will be kept in the hospital.  If negative patient will be given a dose of Lovenox and plan to return to the ED tomorrow for a vascular ultrasound of her upper arm.  CBC without leukocytosis. Hgb stable at 10.8 BMP with stable creatinine 1.11  Lab Results  Component Value Date   CREATININE 1.11 (H) 06/12/2019   CREATININE 1.06 (H) 06/03/2019   CREATININE 1.33 (H) 06/02/2019   Troponin of 3 CXR with residual bibasilar opacities consistent with recent COVID infection  CTA negative for PE. Repeat troponin unremarkable.  Pt given a dose of Lovenox in the ED and scheduled for outpatient DVT study tomorrow. She is advised to follow up with PCP regarding ED vsiit. Strict return precautions discussed. Pt is in agreement with plan and stable for discahrge home.   This note was prepared using Dragon voice recognition software and may include unintentional dictation errors due to the inherent limitations of voice recognition software.  Final Clinical Impression(s) / ED Diagnoses Final diagnoses:  Left arm pain  Shortness of breath    Rx / DC Orders ED Discharge Orders         Ordered    US Venous Img Upper Uni Left (DVT)  Status:  Canceled     06/12/19 2349    US Venous Img Upper Uni Left (DVT)  Status:  Canceled     06/12/19 2350    UE VENOUS DUPLEX     06/12/19 2351           Discharge Instructions     Your labwork and imaging were reassuring tonight We have covered you with a dose of Lovenox in the ED tonight Follow instructions for outpatient ultrasound study as scheduled for tomorrow Follow  up with your PCP regarding your ED visit today Return to the ED for any worsening symptoms including worsening shortness of breath, coughing up blood, chest pain, passing out, or any other concerning symptoms       Tanda Rockers, PA-C 06/12/19 2353    Lorre Nick, MD 06/15/19 2230

## 2019-06-12 NOTE — ED Triage Notes (Signed)
Pt presents with redness in left arm after venous acess line placed placement in hospital on 5/17

## 2019-06-13 ENCOUNTER — Ambulatory Visit (HOSPITAL_BASED_OUTPATIENT_CLINIC_OR_DEPARTMENT_OTHER)
Admission: RE | Admit: 2019-06-13 | Discharge: 2019-06-13 | Disposition: A | Discharge status: Home / Self Care | Payer: BC Managed Care – PPO | Source: Ambulatory Visit

## 2019-06-13 DIAGNOSIS — M7989 Other specified soft tissue disorders: Secondary | ICD-10-CM | POA: Diagnosis not present

## 2019-06-13 NOTE — Progress Notes (Signed)
VASCULAR LAB PRELIMINARY  PRELIMINARY  PRELIMINARY  PRELIMINARY  Left upper extremity venous duplex completed.    Preliminary report:  See CV proc for preliminary results.   Caitland Porchia, RVT 06/13/2019, 12:13 PM

## 2019-06-13 NOTE — ED Provider Notes (Signed)
[facility]    CSN: [id number] Arrival date & time: 06/12/19  1746      History   Chief Complaint Chief Complaint  Patient presents with  . Arm Pain    HPI Erica Odom is a 55 y.o. female.   Patient is a 54 year old female past medical history of anxiety, Baker's cyst, GERD, hypertension, prediabetes, Covid, Covid pneumonia with hypoxia currently on oxygen.  She presents today with left arm pain, erythema.  Symptoms have been constant worsening over the past 3 to 4 days.  The redness and swelling is located to the left anterior forearm and radiates into the left upper arm.  Very tender to touch.  She has been taking pain medication to include oxycodone without any relief of the pain.  She has been losing sleep due to the pain.  She was recently hospitalized and had IV access in that same arm and was given multiple infusions to treat Covid and Covid pneumonia.  Denies any fever, chills, nausea.  No known history of DVT or PE.  Denies any chest pain or shortness of breath at this time.  ROS per HPI      Past Medical History:  Diagnosis Date  . Alopecia   . Anxiety   . Baker's cyst of knee   . GERD (gastroesophageal reflux disease)   . Hypertension   . Prediabetes   . Sleep apnea     Patient Active Problem List   Diagnosis Date Noted  . Acute hypoxemic respiratory failure (HCC) 06/03/2019  . Class 3 obesity 06/03/2019  . Prediabetes 06/03/2019  . Pneumonia due to COVID-19 virus 05/30/2019  . Hyponatremia 05/30/2019  . AKI (acute kidney injury) (HCC) 05/30/2019  . Fever 05/30/2019    Past Surgical History:  Procedure Laterality Date  . APPENDECTOMY    . HEMORRHOID SURGERY    . KNEE SURGERY      OB History   No obstetric history on file.      Home Medications    Prior to Admission medications   Medication Sig Start Date End Date Taking? Authorizing Provider  acetaminophen (TYLENOL) 325 MG tablet Take 650 mg by mouth every 6  (six) hours as needed for mild pain, fever or headache.    [provider]  albuterol (VENTOLIN HFA) 108 (90 Base) MCG/ACT inhaler Inhale 4 puffs into the lungs every 4 (four) hours as needed for wheezing or shortness of breath. 06/03/19   Arrien, York Ram, MD  Ascorbic Acid (VITAMIN C) 1000 MG tablet Take 1,000 mg by mouth daily.    [provider]  fluticasone (FLONASE) 50 MCG/ACT nasal spray Place 1 spray into both nostrils daily as needed for allergies or rhinitis.  05/16/19   [provider]  guaiFENesin-dextromethorphan (ROBITUSSIN DM) 100-10 MG/5ML syrup Take 5 mLs by mouth every 6 (six) hours as needed for cough. 06/03/19   Arrien, York Ram, MD  Multiple Vitamin (MULTIVITAMIN) capsule Take 1 capsule by mouth daily.    [provider]  pantoprazole (PROTONIX) 40 MG tablet Take 1 tablet (40 mg total) by mouth daily. 06/04/19 07/04/19  Arrien, York Ram, MD  VITAMIN D PO Take 1 tablet by mouth daily.     [provider]    Family History Family History  Problem Relation Age of Onset  . Hypertension Mother   . Diabetes Mother   . Hypercholesterolemia Mother   . Cancer Father   . Hypertension Sister   . Diabetes Sister  Social History Social History   Tobacco Use  . Smoking status: Former Games developer  . Smokeless tobacco: Never Used  Substance Use Topics  . Alcohol use: Yes    Comment: occasionally  . Drug use: Never     Allergies   Oxycodone-acetaminophen and Erythromycin   Review of Systems Review of Systems   Physical Exam Triage Vital Signs ED Triage Vitals  Enc Vitals Group     BP 06/12/19 1826 (!) 140/91     Pulse Rate 06/12/19 1826 74     Resp 06/12/19 1826 20     Temp 06/12/19 1826 98.7 F (37.1 C)     Temp src --      SpO2 06/12/19 1826 100 %     Weight --      Height --      Head Circumference --      Peak Flow --      Pain Score 06/12/19 1821 6     Pain Loc --      Pain Edu? --       Excl. in GC? --    No data found.  Updated Vital Signs BP (!) 140/91   Pulse 74   Temp 98.7 F (37.1 C)   Resp 20   SpO2 100%   Visual Acuity Right Eye Distance:   Left Eye Distance:   Bilateral Distance:    Right Eye Near:   Left Eye Near:    Bilateral Near:     Physical Exam Vitals and nursing note reviewed.  Constitutional:      General: She is not in acute distress.    Appearance: Normal appearance. She is not ill-appearing, toxic-appearing or diaphoretic.  HENT:     Head: Normocephalic.     Nose: Nose normal.  Eyes:     Conjunctiva/sclera: Conjunctivae normal.  Pulmonary:     Effort: Pulmonary effort is normal.  Musculoskeletal:        General: Normal range of motion.     Cervical back: Normal range of motion.     Comments: Moderate area of erythema, induration and tenderness to right inner anterior forearm No obvious extension into right upper arm but patient reporting pain in right upper arm   Skin:    General: Skin is warm and dry.     Findings: No rash.  Neurological:     Mental Status: She is alert.  Psychiatric:        Mood and Affect: Mood normal.      UC Treatments / Results  Labs (all labs ordered are listed, but only abnormal results are displayed) Labs Reviewed - No data to display  EKG   Radiology CT Angio Chest PE W/Cm &/Or Wo Cm  Result Date: 06/12/2019 CLINICAL DATA:  Pain in the arm after line placement redness painful to touch EXAM: CT ANGIOGRAPHY CHEST WITH CONTRAST TECHNIQUE: Multidetector CT imaging of the chest was performed using the standard protocol during bolus administration of intravenous contrast. Multiplanar CT image reconstructions and MIPs were obtained to evaluate the vascular anatomy. CONTRAST:  OMNIPAQUE IOHEXOL 350 MG/ML SOLN COMPARISON:  None. FINDINGS: Cardiovascular: There is a optimal opacification of the pulmonary arteries. There is no central,segmental, or subsegmental filling defects within the pulmonary  arteries. The heart is normal in size. No pericardial effusion or thickening. No evidence right heart strain. There is normal three-vessel brachiocephalic anatomy without proximal stenosis. The thoracic aorta is normal in appearance. Mediastinum/Nodes: There is a mildly prominent right hilar lymph  node measuring 1.2 cm. No axillary adenopathy is noted. Thyroid gland, trachea, and esophagus demonstrate no significant findings. Lungs/Pleura: Multifocal peripherally based patchy ground-glass opacities are seen throughout both lungs, predominantly at the lung bases. No pleural effusion or pneumothorax. Upper Abdomen: No acute abnormalities present in the visualized portions of the upper abdomen. Musculoskeletal: No chest wall abnormality. No acute or significant osseous findings. Review of the MIP images confirms the above findings. IMPRESSION: No central, segmental, or subsegmental pulmonary embolism. Bilateral patchy ground-glass opacities throughout both lungs which likely consistent with multifocal pneumonia. Electronically Signed   By: Prudencio Pair M.D.   On: 06/12/2019 23:39   DG Chest Port 1 View  Result Date: 06/12/2019 CLINICAL DATA:  Pt sts left arm pain after recent admission here. Sts they placed an UD guided line in left upper arm after admitted. C/o pain from insertion point radiating to LLE. Redness, and painful to the touch. EXAM: PORTABLE CHEST 1 VIEW COMPARISON:  Chest radiograph 05/30/2019 FINDINGS: Stable cardiomediastinal contours. There are mild residual bibasilar opacities, likely atelectatic change or scarring from previous infection. No pneumothorax or significant pleural effusion. No acute finding in the visualized skeleton. IMPRESSION: Mild residual bibasilar opacities, likely atelectatic change or scarring from previous infection. Electronically Signed   By: Audie Pinto M.D.   On: 06/12/2019 20:28    Procedures Procedures (including critical care time)  Medications Ordered in  UC Medications - No data to display  Initial Impression / Assessment and Plan / UC Course  I have reviewed the triage vital signs and the nursing notes.  Pertinent labs & imaging results that were available during my care of the patient were reviewed by me and considered in my medical decision making (see chart for details).     Left arm pain Based on history, physical exam, previous hospitalization and previous Covid infection we will send to the ER to rule out DVT of the left arm Other differentials include cellulitis or thrombophlebitis Patient understanding and agreed and is heading to the ER Final Clinical Impressions(s) / UC Diagnoses   Final diagnoses:  Left arm pain     Discharge Instructions     Please go to the ER for further evaluation and ultrasound to rule out blood clot.    ED Prescriptions    None     PDMP not reviewed this encounter.   Orvan July, NP 06/13/19 1138

## 2020-04-24 ENCOUNTER — Other Ambulatory Visit: Payer: Self-pay

## 2020-04-24 ENCOUNTER — Ambulatory Visit (HOSPITAL_COMMUNITY)
Admission: EM | Admit: 2020-04-24 | Discharge: 2020-04-24 | Disposition: A | Discharge status: Home / Self Care | Payer: BC Managed Care – PPO | Attending: Emergency Medicine | Admitting: Emergency Medicine

## 2020-04-24 ENCOUNTER — Encounter (HOSPITAL_COMMUNITY): Payer: Self-pay | Admitting: *Deleted

## 2020-04-24 DIAGNOSIS — M62830 Muscle spasm of back: Secondary | ICD-10-CM

## 2020-04-24 DIAGNOSIS — S39012A Strain of muscle, fascia and tendon of lower back, initial encounter: Secondary | ICD-10-CM

## 2020-04-24 LAB — POCT URINALYSIS DIPSTICK, ED / UC
Bilirubin Urine: NEGATIVE
Glucose, UA: NEGATIVE mg/dL
Leukocytes,Ua: NEGATIVE
Nitrite: NEGATIVE
Protein, ur: NEGATIVE mg/dL
Specific Gravity, Urine: 1.025 (ref 1.005–1.030)
Urobilinogen, UA: 1 mg/dL (ref 0.0–1.0)
pH: 5.5 (ref 5.0–8.0)

## 2020-04-24 MED ORDER — NAPROXEN 500 MG PO TABS
500.0000 mg | ORAL_TABLET | Freq: Two times a day (BID) | ORAL | 0 refills | Status: DC
Start: 2020-04-24 — End: 2020-10-19

## 2020-04-24 MED ORDER — TIZANIDINE HCL 4 MG PO TABS
4.0000 mg | ORAL_TABLET | Freq: Four times a day (QID) | ORAL | 0 refills | Status: DC | PRN
Start: 2020-04-24 — End: 2020-10-19

## 2020-04-24 NOTE — Discharge Instructions (Signed)
Take naproxen twice daily for pain.  You may also take Zanaflex as needed for muscle spasms.  Zanaflex can make you very sleepy so you want to take it at night.  Try to do gentle stretches throughout the day.  Continue to drink lots of fluids and water.   Follow-up with your primary care provider or go to the Emergency Department if symptoms do not improve or worsen over the next few days.

## 2020-04-24 NOTE — ED Triage Notes (Signed)
Pt reports lower back pain that started few days ago. Back pain worse last night.

## 2020-04-24 NOTE — ED Provider Notes (Signed)
[facility]    CSN: [id number] Arrival date & time: 04/24/20  1854      History   Chief Complaint Chief Complaint  Patient presents with  . Back Pain    HPI Erica Odom is a 55 y.o. female.   Patient is here for evaluation of lower back pain especially on the right side that has been ongoing for the past several days.  Reports pain gets worse throughout the day and was worse last night and has been previously.  Patient reports pain worse with movement and eases with rest.  Patient reports primarily concerned about kidney infection due to history of high blood pressure and previously taking medications for blood pressure.  Denies any dysuria, urgency, or frequency.  Does report drinking a lot of water today.  Has not tried any OTC medications or treatments. Denies any fevers, chest pain, shortness of breath, N/V/D, numbness, tingling, weakness, abdominal pain, or headaches.   ROS: As per HPI, all other pertinent ROS negative    The history is provided by the patient.  Back Pain   Past Medical History:  Diagnosis Date  . Alopecia   . Anxiety   . Baker's cyst of knee   . GERD (gastroesophageal reflux disease)   . Hypertension   . Prediabetes   . Sleep apnea     Patient Active Problem List   Diagnosis Date Noted  . Acute hypoxemic respiratory failure (HCC) 06/03/2019  . Class 3 obesity 06/03/2019  . Prediabetes 06/03/2019  . Pneumonia due to COVID-19 virus 05/30/2019  . Hyponatremia 05/30/2019  . AKI (acute kidney injury) (HCC) 05/30/2019  . Fever 05/30/2019    Past Surgical History:  Procedure Laterality Date  . APPENDECTOMY    . HEMORRHOID SURGERY    . KNEE SURGERY      OB History   No obstetric history on file.      Home Medications    Prior to Admission medications   Medication Sig Start Date End Date Taking? Authorizing Provider  naproxen (NAPROSYN) 500 MG tablet Take 1 tablet (500 mg total) by mouth 2 (two) times daily.  04/24/20  Yes Ivette Loyal, NP  tiZANidine (ZANAFLEX) 4 MG tablet Take 1 tablet (4 mg total) by mouth every 6 (six) hours as needed for muscle spasms. 04/24/20  Yes Ivette Loyal, NP  acetaminophen (TYLENOL) 325 MG tablet Take 650 mg by mouth every 6 (six) hours as needed for mild pain, fever or headache.    [provider]  albuterol (VENTOLIN HFA) 108 (90 Base) MCG/ACT inhaler Inhale 4 puffs into the lungs every 4 (four) hours as needed for wheezing or shortness of breath. 06/03/19   Arrien, York Ram, MD  Ascorbic Acid (VITAMIN C) 1000 MG tablet Take 1,000 mg by mouth daily.    [provider]  fluticasone (FLONASE) 50 MCG/ACT nasal spray Place 1 spray into both nostrils daily as needed for allergies or rhinitis.  05/16/19   [provider]  guaiFENesin-dextromethorphan (ROBITUSSIN DM) 100-10 MG/5ML syrup Take 5 mLs by mouth every 6 (six) hours as needed for cough. 06/03/19   Arrien, York Ram, MD  Multiple Vitamin (MULTIVITAMIN) capsule Take 1 capsule by mouth daily.    [provider]  pantoprazole (PROTONIX) 40 MG tablet Take 1 tablet (40 mg total) by mouth daily. 06/04/19 07/04/19  Arrien, York Ram, MD  VITAMIN D PO Take 1 tablet by mouth daily.     [provider]  Family History Family History  Problem Relation Age of Onset  . Hypertension Mother   . Diabetes Mother   . Hypercholesterolemia Mother   . Cancer Father   . Hypertension Sister   . Diabetes Sister     Social History Social History   Tobacco Use  . Smoking status: Former Games developer  . Smokeless tobacco: Never Used  Vaping Use  . Vaping Use: Never used  Substance Use Topics  . Alcohol use: Yes    Comment: occasionally  . Drug use: Never     Allergies   Oxycodone-acetaminophen and Erythromycin   Review of Systems Review of Systems  Musculoskeletal: Positive for back pain.  All other systems reviewed and are negative.    Physical Exam Triage  Vital Signs ED Triage Vitals  Enc Vitals Group     BP 04/24/20 1925 (!) 141/77     Pulse Rate 04/24/20 1925 76     Resp 04/24/20 1925 18     Temp 04/24/20 1925 99.3 F (37.4 C)     Temp src --      SpO2 04/24/20 1925 97 %     Weight --      Height --      Head Circumference --      Peak Flow --      Pain Score 04/24/20 1921 10     Pain Loc --      Pain Edu? --      Excl. in GC? --    No data found.  Updated Vital Signs BP (!) 141/77 (BP Location: Right Arm)   Pulse 76   Temp 99.3 F (37.4 C)   Resp 18   SpO2 97%   Visual Acuity Right Eye Distance:   Left Eye Distance:   Bilateral Distance:    Right Eye Near:   Left Eye Near:    Bilateral Near:     Physical Exam Vitals and nursing note reviewed.  Constitutional:      General: She is not in acute distress.    Appearance: Normal appearance. She is not ill-appearing, toxic-appearing or diaphoretic.  HENT:     Head: Normocephalic and atraumatic.  Eyes:     Conjunctiva/sclera: Conjunctivae normal.  Cardiovascular:     Rate and Rhythm: Normal rate.     Pulses: Normal pulses.  Pulmonary:     Effort: Pulmonary effort is normal.  Abdominal:     General: Abdomen is flat.     Tenderness: There is no right CVA tenderness or left CVA tenderness.  Musculoskeletal:     Cervical back: Normal range of motion.     Lumbar back: Tenderness present. No swelling, edema, deformity or signs of trauma. Decreased range of motion (decreased active ROM due to pain). Negative right straight leg raise test and negative left straight leg raise test.  Skin:    General: Skin is warm and dry.  Neurological:     General: No focal deficit present.     Mental Status: She is alert and oriented to person, place, and time.  Psychiatric:        Mood and Affect: Mood normal.      UC Treatments / Results  Labs (all labs ordered are listed, but only abnormal results are displayed) Labs Reviewed  POCT URINALYSIS DIPSTICK, ED / UC -  Abnormal; Notable for the following components:      Result Value   Ketones, ur TRACE (*)    Hgb urine dipstick SMALL (*)  All other components within normal limits    EKG   Radiology No results found.  Procedures Procedures (including critical care time)  Medications Ordered in UC Medications - No data to display  Initial Impression / Assessment and Plan / UC Course  I have reviewed the triage vital signs and the nursing notes.  Pertinent labs & imaging results that were available during my care of the patient were reviewed by me and considered in my medical decision making (see chart for details).     Lumbar strain and muscle spasm.  Assessment negative for red flags or concerns.  Urinalysis with small hemoglobin and trace ketones but no concerns for infection.  Prescribed naproxen twice daily and Zanaflex as needed for muscle spasms.  Recommend gentle stretching throughout the day.  Encourage fluids and rest.  Follow-up with primary care or go to the emergency room for worsening symptoms or no improvement.   Final Clinical Impressions(s) / UC Diagnoses   Final diagnoses:  Strain of lumbar region, initial encounter  Muscle spasm of back     Discharge Instructions     Take naproxen twice daily for pain.  You may also take Zanaflex as needed for muscle spasms.  Zanaflex can make you very sleepy so you want to take it at night.  Try to do gentle stretches throughout the day.  Continue to drink lots of fluids and water.   Follow-up with your primary care provider or go to the Emergency Department if symptoms do not improve or worsen over the next few days.     ED Prescriptions    Medication Sig Dispense Auth. Provider   naproxen (NAPROSYN) 500 MG tablet Take 1 tablet (500 mg total) by mouth 2 (two) times daily. 30 tablet Ivette Loyal, NP   tiZANidine (ZANAFLEX) 4 MG tablet Take 1 tablet (4 mg total) by mouth every 6 (six) hours as needed for muscle spasms. 30  tablet Ivette Loyal, NP     PDMP not reviewed this encounter.   Ivette Loyal, NP 04/24/20 2017

## 2020-08-01 ENCOUNTER — Ambulatory Visit (HOSPITAL_COMMUNITY): Admission: EM | Admit: 2020-08-01 | Discharge: 2020-08-01 | Discharge status: Against Medical Advice | Payer: Self-pay

## 2020-08-01 ENCOUNTER — Other Ambulatory Visit: Payer: Self-pay

## 2020-10-18 ENCOUNTER — Emergency Department (HOSPITAL_COMMUNITY): Payer: Self-pay

## 2020-10-18 ENCOUNTER — Encounter (HOSPITAL_COMMUNITY): Payer: Self-pay | Admitting: *Deleted

## 2020-10-18 ENCOUNTER — Encounter (HOSPITAL_COMMUNITY): Payer: Self-pay | Admitting: Emergency Medicine

## 2020-10-18 ENCOUNTER — Other Ambulatory Visit: Payer: Self-pay

## 2020-10-18 ENCOUNTER — Ambulatory Visit (HOSPITAL_COMMUNITY)
Admission: EM | Admit: 2020-10-18 | Discharge: 2020-10-18 | Disposition: A | Discharge status: Home / Self Care | Payer: BLUE CROSS/BLUE SHIELD

## 2020-10-18 ENCOUNTER — Emergency Department (HOSPITAL_COMMUNITY)
Admission: EM | Admit: 2020-10-18 | Discharge: 2020-10-19 | Disposition: A | Discharge status: Home / Self Care | Payer: Self-pay | Attending: Physician Assistant | Admitting: Physician Assistant

## 2020-10-18 DIAGNOSIS — R519 Headache, unspecified: Secondary | ICD-10-CM

## 2020-10-18 DIAGNOSIS — R202 Paresthesia of skin: Secondary | ICD-10-CM | POA: Insufficient documentation

## 2020-10-18 DIAGNOSIS — Z5321 Procedure and treatment not carried out due to patient leaving prior to being seen by health care provider: Secondary | ICD-10-CM | POA: Insufficient documentation

## 2020-10-18 DIAGNOSIS — R030 Elevated blood-pressure reading, without diagnosis of hypertension: Secondary | ICD-10-CM

## 2020-10-18 DIAGNOSIS — R200 Anesthesia of skin: Secondary | ICD-10-CM | POA: Diagnosis not present

## 2020-10-18 LAB — CBC WITH DIFFERENTIAL/PLATELET
Abs Immature Granulocytes: 0.01 10*3/uL (ref 0.00–0.07)
Basophils Absolute: 0 10*3/uL (ref 0.0–0.1)
Basophils Relative: 0 %
Eosinophils Absolute: 0.4 10*3/uL (ref 0.0–0.5)
Eosinophils Relative: 5 %
HCT: 38.9 % (ref 36.0–46.0)
Hemoglobin: 12.4 g/dL (ref 12.0–15.0)
Immature Granulocytes: 0 %
Lymphocytes Relative: 41 %
Lymphs Abs: 3.1 10*3/uL (ref 0.7–4.0)
MCH: 25.8 pg — ABNORMAL LOW (ref 26.0–34.0)
MCHC: 31.9 g/dL (ref 30.0–36.0)
MCV: 80.9 fL (ref 80.0–100.0)
Monocytes Absolute: 0.6 10*3/uL (ref 0.1–1.0)
Monocytes Relative: 7 %
Neutro Abs: 3.5 10*3/uL (ref 1.7–7.7)
Neutrophils Relative %: 47 %
Platelets: 390 10*3/uL (ref 150–400)
RBC: 4.81 MIL/uL (ref 3.87–5.11)
RDW: 15.9 % — ABNORMAL HIGH (ref 11.5–15.5)
WBC: 7.5 10*3/uL (ref 4.0–10.5)
nRBC: 0 % (ref 0.0–0.2)

## 2020-10-18 LAB — BASIC METABOLIC PANEL
Anion gap: 11 (ref 5–15)
BUN: 9 mg/dL (ref 6–20)
CO2: 25 mmol/L (ref 22–32)
Calcium: 9.4 mg/dL (ref 8.9–10.3)
Chloride: 101 mmol/L (ref 98–111)
Creatinine, Ser: 0.85 mg/dL (ref 0.44–1.00)
GFR, Estimated: >60 mL/min (ref >60)
Glucose, Bld: 94 mg/dL (ref 70–99)
Potassium: 3.9 mmol/L (ref 3.5–5.1)
Sodium: 137 mmol/L (ref 135–145)

## 2020-10-18 LAB — PROTIME-INR
INR: 0.9 (ref 0.8–1.2)
Prothrombin Time: 12.6 seconds (ref 11.4–15.2)

## 2020-10-18 LAB — URINALYSIS, ROUTINE W REFLEX MICROSCOPIC
Bilirubin Urine: NEGATIVE
Glucose, UA: NEGATIVE mg/dL
Hgb urine dipstick: NEGATIVE
Ketones, ur: NEGATIVE mg/dL
Leukocytes,Ua: NEGATIVE
Nitrite: NEGATIVE
Protein, ur: NEGATIVE mg/dL
Specific Gravity, Urine: 1.013 (ref 1.005–1.030)
pH: 6 (ref 5.0–8.0)

## 2020-10-18 NOTE — ED Triage Notes (Signed)
Pt is present today with left foot and leg numbness, HA, and tingling. Pt states that noticed her numbness Sunday.

## 2020-10-18 NOTE — ED Provider Notes (Signed)
Emergency Medicine Provider Triage Evaluation Note  Erica Odom , a 55 y.o. female  was evaluated in triage.  Pt complains of headache and right lower extremity paresthesias.  Patient reports she was seen at urgent care who referred her here for evaluation of strokelike symptoms.  Patient reports she has had a headache since Sunday with elevated BP of 145 at home so she presented to the urgent care.  She reports just prior to being seen (around 1745) she developed right lower extremity paresthesias starting at her foot which have now increased to the knee.  She later reports she had numbness in her toe this morning.  She denies visual changes, balance issues, speech issues.   Review of Systems  Positive: Headache, paresthesias in right lower extremity Negative: Visual change, weakness, speech issues, facial droop  Physical Exam  There were no vitals taken for this visit. Gen:   Awake, no distress   Resp:  Normal effort  MSK:   Moves extremities without difficulty  Other:  Strength is 5/5 in all 4 extremities including grip.  Sensation in bilateral lower extremities symmetric and intact.  Cranial nerves II-XII are intact.   Medical Decision Making  Medically screening exam initiated at 7:52 PM.  Appropriate orders placed.  Erica Odom was informed that the remainder of the evaluation will be completed by another provider, this initial triage assessment does not replace that evaluation, and the importance of remaining in the ED until their evaluation is complete.  Discussed with Dr.  Audrie Lia.  Will not activate code stroke but will order labs and CT head without contrast.  Headache, RLE paresthesias.   Marita Kansas, PA-C 10/18/20 1958    Glendora Score, MD 10/18/20 [phone]

## 2020-10-18 NOTE — ED Triage Notes (Signed)
Pt sent here by UC for r/o stroke.  Numbness to L toe for some time, and headache since Sunday.  No other deficits noted.

## 2020-10-18 NOTE — ED Notes (Signed)
Patient is being discharged from the Urgent Care and sent to the Emergency Department via POV . Per Hansel Starling, NP, patient is in need of higher level of care due to headache, numbness and dizziness. Patient is aware and verbalizes understanding of plan of care.  Vitals:   10/18/20 1904 10/18/20 1906  BP:  (!) 182/102  Pulse: 88   Resp: 18   Temp: 98.6 F (37 C)   SpO2: 99%

## 2020-10-19 ENCOUNTER — Emergency Department (HOSPITAL_COMMUNITY)
Admission: EM | Admit: 2020-10-19 | Discharge: 2020-10-19 | Disposition: A | Discharge status: Home / Self Care | Payer: BLUE CROSS/BLUE SHIELD | Attending: Emergency Medicine | Admitting: Emergency Medicine

## 2020-10-19 ENCOUNTER — Emergency Department (HOSPITAL_COMMUNITY): Payer: BLUE CROSS/BLUE SHIELD

## 2020-10-19 DIAGNOSIS — Z8616 Personal history of COVID-19: Secondary | ICD-10-CM | POA: Diagnosis not present

## 2020-10-19 DIAGNOSIS — Z87891 Personal history of nicotine dependence: Secondary | ICD-10-CM | POA: Insufficient documentation

## 2020-10-19 DIAGNOSIS — R202 Paresthesia of skin: Secondary | ICD-10-CM | POA: Insufficient documentation

## 2020-10-19 DIAGNOSIS — R791 Abnormal coagulation profile: Secondary | ICD-10-CM | POA: Diagnosis not present

## 2020-10-19 DIAGNOSIS — Z79899 Other long term (current) drug therapy: Secondary | ICD-10-CM | POA: Diagnosis not present

## 2020-10-19 DIAGNOSIS — R519 Headache, unspecified: Secondary | ICD-10-CM | POA: Insufficient documentation

## 2020-10-19 DIAGNOSIS — I10 Essential (primary) hypertension: Secondary | ICD-10-CM | POA: Insufficient documentation

## 2020-10-19 LAB — COMPREHENSIVE METABOLIC PANEL
ALT: 17 U/L (ref 0–44)
AST: 18 U/L (ref 15–41)
Albumin: 3.6 g/dL (ref 3.5–5.0)
Alkaline Phosphatase: 84 U/L (ref 38–126)
Anion gap: 10 (ref 5–15)
BUN: 8 mg/dL (ref 6–20)
CO2: 25 mmol/L (ref 22–32)
Calcium: 9.3 mg/dL (ref 8.9–10.3)
Chloride: 102 mmol/L (ref 98–111)
Creatinine, Ser: 0.93 mg/dL (ref 0.44–1.00)
GFR, Estimated: >60 mL/min (ref >60)
Glucose, Bld: 136 mg/dL — ABNORMAL HIGH (ref 70–99)
Potassium: 3.7 mmol/L (ref 3.5–5.1)
Sodium: 137 mmol/L (ref 135–145)
Total Bilirubin: 0.5 mg/dL (ref 0.3–1.2)
Total Protein: 7.5 g/dL (ref 6.5–8.1)

## 2020-10-19 LAB — CBC
HCT: 37.6 % (ref 36.0–46.0)
Hemoglobin: 12 g/dL (ref 12.0–15.0)
MCH: 25.8 pg — ABNORMAL LOW (ref 26.0–34.0)
MCHC: 31.9 g/dL (ref 30.0–36.0)
MCV: 80.9 fL (ref 80.0–100.0)
Platelets: 337 10*3/uL (ref 150–400)
RBC: 4.65 MIL/uL (ref 3.87–5.11)
RDW: 15.8 % — ABNORMAL HIGH (ref 11.5–15.5)
WBC: 5.7 10*3/uL (ref 4.0–10.5)
nRBC: 0 % (ref 0.0–0.2)

## 2020-10-19 LAB — DIFFERENTIAL
Abs Immature Granulocytes: 0.01 10*3/uL (ref 0.00–0.07)
Basophils Absolute: 0.1 10*3/uL (ref 0.0–0.1)
Basophils Relative: 1 %
Eosinophils Absolute: 0.3 10*3/uL (ref 0.0–0.5)
Eosinophils Relative: 5 %
Immature Granulocytes: 0 %
Lymphocytes Relative: 40 %
Lymphs Abs: 2.3 10*3/uL (ref 0.7–4.0)
Monocytes Absolute: 0.5 10*3/uL (ref 0.1–1.0)
Monocytes Relative: 8 %
Neutro Abs: 2.6 10*3/uL (ref 1.7–7.7)
Neutrophils Relative %: 46 %

## 2020-10-19 LAB — I-STAT CHEM 8, ED
BUN: 7 mg/dL (ref 6–20)
Calcium, Ion: 1.14 mmol/L — ABNORMAL LOW (ref 1.15–1.40)
Chloride: 102 mmol/L (ref 98–111)
Creatinine, Ser: 0.9 mg/dL (ref 0.44–1.00)
Glucose, Bld: 135 mg/dL — ABNORMAL HIGH (ref 70–99)
HCT: 38 % (ref 36.0–46.0)
Hemoglobin: 12.9 g/dL (ref 12.0–15.0)
Potassium: 3.7 mmol/L (ref 3.5–5.1)
Sodium: 139 mmol/L (ref 135–145)
TCO2: 28 mmol/L (ref 22–32)

## 2020-10-19 LAB — PROTIME-INR
INR: 1 (ref 0.8–1.2)
Prothrombin Time: 13.4 seconds (ref 11.4–15.2)

## 2020-10-19 LAB — APTT: aPTT: 33 seconds (ref 24–36)

## 2020-10-19 MED ORDER — CHLORTHALIDONE 25 MG PO TABS: 25.0000 mg | ORAL_TABLET | Freq: Every day | ORAL | 0 refills | Status: AC

## 2020-10-19 NOTE — ED Notes (Signed)
Remains in MRI 

## 2020-10-19 NOTE — ED Notes (Signed)
Pt discharged and ambulated out of the ED without difficulty. 

## 2020-10-19 NOTE — ED Provider Notes (Signed)
[facility] EMERGENCY DEPARTMENT Provider Note   CSN: [id number] Arrival date & time: 10/19/20  0758     History Chief Complaint  Patient presents with   Numbness    Erica Odom is a 55 y.o. female.  55 year old female with prior medical history as detailed below presents for evaluation.  Patient complains of significant global headache that began yesterday.  This headache was associated with elevated blood pressure.  She also experienced tingling of her right lower extremity primarily around the right big toe and right foot.  She denies weakness.  She was seen at urgent care yesterday and referred to the ED for possible stroke work-up.  She waited for some time in the ED waiting room.  She left prior to full evaluation given prolonged wait times.  She returns now today for evaluation.  She complains of significant improvement in her headache.  She complains of persistent mild hypertension.  She is not currently taking any antihypertensives.  She denies prior history of stroke.  She is ambulatory without difficulty.  She denies weakness.  She denies visual change.  She reports that the tingling discomfort in her right foot is somewhat improved.  She is able to ambulate without any difficulty.  The history is provided by the patient.  Illness Location:  Headache, hypertension, right foot paresthesia Severity:  Mild Onset quality:  Gradual Duration:  2 days Timing:  Rare Progression:  Partially resolved Chronicity:  New     Past Medical History:  Diagnosis Date   Alopecia    Anxiety    Baker's cyst of knee    GERD (gastroesophageal reflux disease)    Hypertension    Prediabetes    Sleep apnea     Patient Active Problem List   Diagnosis Date Noted   Acute hypoxemic respiratory failure (HCC) 06/03/2019   Class 3 obesity (HCC) 06/03/2019   Prediabetes 06/03/2019   Pneumonia due to COVID-19 virus 05/30/2019   Hyponatremia 05/30/2019   AKI  (acute kidney injury) (HCC) 05/30/2019   Fever 05/30/2019    Past Surgical History:  Procedure Laterality Date   APPENDECTOMY     HEMORRHOID SURGERY     KNEE SURGERY       OB History   No obstetric history on file.     Family History  Problem Relation Age of Onset   Hypertension Mother    Diabetes Mother    Hypercholesterolemia Mother    Cancer Father    Hypertension Sister    Diabetes Sister     Social History   Tobacco Use   Smoking status: Former   Smokeless tobacco: Never  Building services engineer Use: Never used  Substance Use Topics   Alcohol use: Yes    Comment: occasionally   Drug use: Never    Home Medications Prior to Admission medications   Medication Sig Start Date End Date Taking? Authorizing Provider  acetaminophen (TYLENOL) 325 MG tablet Take 650 mg by mouth every 6 (six) hours as needed for mild pain, fever or headache.    [provider]  albuterol (VENTOLIN HFA) 108 (90 Base) MCG/ACT inhaler Inhale 4 puffs into the lungs every 4 (four) hours as needed for wheezing or shortness of breath. 06/03/19   Arrien, York Ram, MD  Ascorbic Acid (VITAMIN C) 1000 MG tablet Take 1,000 mg by mouth daily.    [provider]  fluticasone (FLONASE) 50 MCG/ACT nasal spray Place 1 spray into both nostrils daily as  needed for allergies or rhinitis.  05/16/19   [provider]  guaiFENesin-dextromethorphan (ROBITUSSIN DM) 100-10 MG/5ML syrup Take 5 mLs by mouth every 6 (six) hours as needed for cough. 06/03/19   Arrien, York Ram, MD  losartan (COZAAR) 25 MG tablet Take 25 mg by mouth daily. 10/14/20   [provider]  Multiple Vitamin (MULTIVITAMIN) capsule Take 1 capsule by mouth daily.    [provider]  naproxen (NAPROSYN) 500 MG tablet Take 1 tablet (500 mg total) by mouth 2 (two) times daily. 04/24/20   Ivette Loyal, NP  pantoprazole (PROTONIX) 40 MG tablet Take 1 tablet (40 mg total) by mouth daily. 06/04/19  07/04/19  Arrien, York Ram, MD  tiZANidine (ZANAFLEX) 4 MG tablet Take 1 tablet (4 mg total) by mouth every 6 (six) hours as needed for muscle spasms. 04/24/20   Ivette Loyal, NP  VITAMIN D PO Take 1 tablet by mouth daily.     [provider]    Allergies    Oxycodone-acetaminophen and Erythromycin  Review of Systems   Review of Systems  All other systems reviewed and are negative.  Physical Exam Updated Vital Signs BP (!) 148/86 (BP Location: Left Arm)   Pulse 73   Temp 98.4 F (36.9 C)   Resp 18   SpO2 98%   Physical Exam Vitals and nursing note reviewed.  Constitutional:      General: She is not in acute distress.    Appearance: Normal appearance. She is well-developed.  HENT:     Head: Normocephalic and atraumatic.  Eyes:     Conjunctiva/sclera: Conjunctivae normal.     Pupils: Pupils are equal, round, and reactive to light.  Cardiovascular:     Rate and Rhythm: Normal rate and regular rhythm.     Heart sounds: Normal heart sounds.  Pulmonary:     Effort: Pulmonary effort is normal. No respiratory distress.     Breath sounds: Normal breath sounds.  Abdominal:     General: There is no distension.     Palpations: Abdomen is soft.     Tenderness: There is no abdominal tenderness.  Musculoskeletal:        General: No deformity. Normal range of motion.     Cervical back: Normal range of motion and neck supple.  Skin:    General: Skin is warm and dry.  Neurological:     General: No focal deficit present.     Mental Status: She is alert and oriented to person, place, and time. Mental status is at baseline.     Cranial Nerves: No cranial nerve deficit.     Motor: No weakness.     Coordination: Coordination normal.     Comments: Aox4 Normal speech No facial droop VAN negative  5/5 Strength to BUE and BLE  Localizes "tingling" paresthesia to right foot - sock on foot distribution - from ankle distal to toes.    ED Results / Procedures /  Treatments   Labs (all labs ordered are listed, but only abnormal results are displayed) Labs Reviewed  CBC - Abnormal; Notable for the following components:      Result Value   MCH 25.8 (*)    RDW 15.8 (*)    All other components within normal limits  COMPREHENSIVE METABOLIC PANEL - Abnormal; Notable for the following components:   Glucose, Bld 136 (*)    All other components within normal limits  I-STAT CHEM 8, ED - Abnormal; Notable for the following  components:   Glucose, Bld 135 (*)    Calcium, Ion 1.14 (*)    All other components within normal limits  PROTIME-INR  APTT  DIFFERENTIAL    EKG None  Radiology CT HEAD WO CONTRAST ( )  Result Date: 10/18/2020 CLINICAL DATA:  Neurological deficit. EXAM: CT HEAD WITHOUT CONTRAST TECHNIQUE: Contiguous axial images were obtained from the base of the skull through the vertex without intravenous contrast. COMPARISON:  November 22, 2017 FINDINGS: Brain: There is mild cerebral atrophy with widening of the extra-axial spaces and ventricular dilatation. There are areas of decreased attenuation within the white matter tracts of the supratentorial brain, consistent with microvascular disease changes. Vascular: No hyperdense vessel or unexpected calcification. Skull: Normal. Negative for fracture or focal lesion. Sinuses/Orbits: No acute finding. Other: None. IMPRESSION: 1. Generalized cerebral atrophy. 2. No acute intracranial abnormality. Electronically Signed   By: Aram Candela M.D.   On: 10/18/2020 21:46    Procedures Procedures   Medications Ordered in ED Medications - No data to display  ED Course  I have reviewed the triage vital signs and the nursing notes.  Pertinent labs & imaging results that were available during my care of the patient were reviewed by me and considered in my medical decision making (see chart for details).    MDM Rules/Calculators/A&P                           MDM  MSE complete  Erica Odom was evaluated in Emergency Department on 10/19/2020 for the symptoms described in the history of present illness. She was evaluated in the context of the global COVID-19 pandemic, which necessitated consideration that the patient might be at risk for infection with the SARS-CoV-2 virus that causes COVID-19. Institutional protocols and algorithms that pertain to the evaluation of patients at risk for COVID-19 are in a state of rapid change based on information released by regulatory bodies including the CDC and federal and state organizations. These policies and algorithms were followed during the patient's care in the ED.   Patient is presenting with complaint of headache, paresthesia, and hypertension.  Patient's work-up is without evidence of acute CVA or other significant acute pathology.  She is visibly relieved by work-up results.  She does understand need for close follow-up with her PCP.  She is requesting refill on Chlorthalidone 25mg .  Importance of close follow-up is stressed.  Strict return precautions given and understood.  Final Clinical Impression(s) / ED Diagnoses Final diagnoses:  Nonintractable headache, unspecified chronicity pattern, unspecified headache type  Paresthesia  Hypertension, unspecified type    Rx / DC Orders ED Discharge Orders          Ordered    chlorthalidone (HYGROTON) 25 MG tablet  Daily        10/19/20 1936             12/19/20, MD 10/19/20 [phone]

## 2020-10-19 NOTE — Discharge Instructions (Addendum)
Return for any problem.  ?

## 2020-10-19 NOTE — ED Notes (Signed)
Pt called 2x no answer 

## 2020-10-19 NOTE — ED Notes (Signed)
Patient transported to MRI 

## 2020-10-19 NOTE — ED Triage Notes (Signed)
Pt here again for continued tingling in R leg from knee to toes onset yesterday. Seen for same yesterday and LWBS d/t wait. Endorses headache. Lab work and CT scan done last night.

## 2020-10-22 NOTE — ED Provider Notes (Signed)
[facility]    CSN: [id number] Arrival date & time: 10/18/20  1824      History   Chief Complaint Chief Complaint  Patient presents with   Hypertension   Numbness    Rt foot     HPI Erica Odom is a 55 y.o. female.   Patient presents with elevated blood pressure readings at home SBP 150s, DBP 90s, constant generalized headache and numbness and tingling in left lower extremity for 4 days. Denies chest pain, shortness of breath, blurred vision, floaters, nausea, vomiting, slurred speech, weakness, dizziness, lightheadedness.  History of HTN prediabetes, obesity, anxiety and GERD.    Past Medical History:  Diagnosis Date   Alopecia    Anxiety    Baker's cyst of knee    GERD (gastroesophageal reflux disease)    Hypertension    Prediabetes    Sleep apnea     Patient Active Problem List   Diagnosis Date Noted   Acute hypoxemic respiratory failure (HCC) 06/03/2019   Class 3 obesity (HCC) 06/03/2019   Prediabetes 06/03/2019   Pneumonia due to COVID-19 virus 05/30/2019   Hyponatremia 05/30/2019   AKI (acute kidney injury) (HCC) 05/30/2019   Fever 05/30/2019    Past Surgical History:  Procedure Laterality Date   APPENDECTOMY     HEMORRHOID SURGERY     KNEE SURGERY      OB History   No obstetric history on file.      Home Medications    Prior to Admission medications   Medication Sig Start Date End Date Taking? Authorizing Provider  acetaminophen (TYLENOL) 325 MG tablet Take 650 mg by mouth every 6 (six) hours as needed for mild pain, fever or headache.    [provider]  albuterol (VENTOLIN HFA) 108 (90 Base) MCG/ACT inhaler Inhale 4 puffs into the lungs every 4 (four) hours as needed for wheezing or shortness of breath. 06/03/19   Arrien, York Ram, MD  Ascorbic Acid (VITAMIN C) 1000 MG tablet Take 1,000 mg by mouth daily.    [provider]  chlorthalidone (HYGROTON) 25 MG tablet Take 1 tablet (25 mg total)  by mouth daily. 10/19/20   Wynetta Fines, MD  fluticasone (FLONASE) 50 MCG/ACT nasal spray Place 1 spray into both nostrils daily as needed for allergies or rhinitis.  05/16/19   [provider]  guaiFENesin-dextromethorphan (ROBITUSSIN DM) 100-10 MG/5ML syrup Take 5 mLs by mouth every 6 (six) hours as needed for cough. 06/03/19   Arrien, York Ram, MD  losartan (COZAAR) 25 MG tablet Take 25 mg by mouth daily. Patient not taking: No sig reported 10/14/20   [provider]  Multiple Vitamin (MULTIVITAMIN) capsule Take 1 capsule by mouth daily.    [provider]  pantoprazole (PROTONIX) 40 MG tablet Take 1 tablet (40 mg total) by mouth daily. 06/04/19 10/19/20  Arrien, York Ram, MD  VITAMIN D PO Take 1 tablet by mouth daily.     [provider]    Family History Family History  Problem Relation Age of Onset   Hypertension Mother    Diabetes Mother    Hypercholesterolemia Mother    Cancer Father    Hypertension Sister    Diabetes Sister     Social History Social History   Tobacco Use   Smoking status: Former   Smokeless tobacco: Never  Building services engineer Use: Never used  Substance Use Topics   Alcohol use: Yes    Comment: occasionally  Drug use: Never     Allergies   Oxycodone-acetaminophen, Tyloxapol, and Erythromycin   Review of Systems Review of Systems   Physical Exam Triage Vital Signs ED Triage Vitals  Enc Vitals Group     BP 10/18/20 1906 (!) 182/102     Pulse Rate 10/18/20 1904 88     Resp 10/18/20 1904 18     Temp 10/18/20 1904 98.6 F (37 C)     Temp src --      SpO2 10/18/20 1904 99 %     Weight --      Height --      Head Circumference --      Peak Flow --      Pain Score 10/18/20 1906 0     Pain Loc --      Pain Edu? --      Excl. in GC? --    No data found.  Updated Vital Signs BP (!) 182/102   Pulse 88   Temp 98.6 F (37 C)   Resp 18   SpO2 99%   Visual Acuity Right Eye Distance:    Left Eye Distance:   Bilateral Distance:    Right Eye Near:   Left Eye Near:    Bilateral Near:     Physical Exam   UC Treatments / Results  Labs (all labs ordered are listed, but only abnormal results are displayed) Labs Reviewed - No data to display  EKG   Radiology No results found.  Procedures Procedures (including critical care time)  Medications Ordered in UC Medications - No data to display  Initial Impression / Assessment and Plan / UC Course  I have reviewed the triage vital signs and the nursing notes.  Pertinent labs & imaging results that were available during my care of the patient were reviewed by me and considered in my medical decision making (see chart for details).   Patient sent to emergency department for further evaluation of hypertensive urgency and rule out CVA.Current blood pressure 182/102. Discussed with patient concerns and capabilities of UC. Family at bedside. In agreement with plan of care, family to escort patient via car.    Final Clinical Impressions(s) / UC Diagnoses   Final diagnoses:  None   Discharge Instructions   None    ED Prescriptions   None    PDMP not reviewed this encounter.   Valinda Hoar, NP 10/22/20 1408

## 2022-02-10 ENCOUNTER — Ambulatory Visit (HOSPITAL_COMMUNITY)
Admission: EM | Admit: 2022-02-10 | Discharge: 2022-02-10 | Disposition: A | Discharge status: Home / Self Care | Payer: BLUE CROSS/BLUE SHIELD | Attending: Internal Medicine | Admitting: Internal Medicine

## 2022-02-10 ENCOUNTER — Encounter (HOSPITAL_COMMUNITY): Payer: Self-pay | Admitting: Emergency Medicine

## 2022-02-10 DIAGNOSIS — J069 Acute upper respiratory infection, unspecified: Secondary | ICD-10-CM | POA: Diagnosis not present

## 2022-02-10 MED ORDER — PROMETHAZINE-DM 6.25-15 MG/5ML PO SYRP: 5.0000 mL | ORAL_SOLUTION | Freq: Every evening | ORAL | 0 refills | Status: AC | PRN

## 2022-02-10 MED ORDER — BENZONATATE 100 MG PO CAPS: 100.0000 mg | ORAL_CAPSULE | Freq: Three times a day (TID) | ORAL | 0 refills | Status: AC

## 2022-02-10 NOTE — ED Triage Notes (Signed)
Pt has cough-sometimes productive, congestion, sore throat, ear pain, since Thursday. reports upper abd pain more on right side with coughing.  Took Mucinex, Sudafed, cold and flu without relief.

## 2022-02-10 NOTE — Discharge Instructions (Signed)
You have a viral upper respiratory infection.   Use the following medicines to help with symptoms: - Plain Mucinex (guaifenesin) over the counter as directed every 12 hours to thin mucous so that you are able to get it out of your body easier. Drink plenty of water while taking this medication so that it works well in your body (at least 8 cups a day).  - Tylenol 1,000mg and/or ibuprofen 600mg every 6 hours with food as needed for aches/pains or fever/chills.  - Tessalon perles every 8 hours as needed for cough. - Take Promethazine DM cough medication to help with your cough at nighttime so that you are able to sleep. Do not drive, drink alcohol, or go to work while taking this medication since it can make you sleepy. Only take this at nighttime.   1 tablespoon of honey in warm water and/or salt water gargles may also help with symptoms. Humidifier to your room will help add water to the air and reduce coughing.  If you develop any new or worsening symptoms, please return.  If your symptoms are severe, please go to the emergency room.  Follow-up with your primary care provider for further evaluation and management of your symptoms as well as ongoing wellness visits.  I hope you feel better!  

## 2022-02-10 NOTE — ED Provider Notes (Signed)
[facility]    CSN: [id number] Arrival date & time: 02/10/22  1814      History   Chief Complaint Chief Complaint  Patient presents with   Cough    HPI Erica Odom is a 57 y.o. female.   Patient presents to urgent care for evaluation of cough, nasal congestion, and fever/chills that started 5 days ago on Wednesday February 05, 2022 (5 days ago) while she was traveling. Patient [place] in the airport [place] way home when symptoms began with slight tickle to the throat and sore throat then progressed into fever, chills, and cough, and nasal congestion.  Reports generalized fatigue over the last few days as well.  Denies history of chronic respiratory problems.  History of pneumonia due to COVID-19 with associated hospitalization in 2021.  She has not short of breath and denies weakness/heart palpitations.  She does report bilateral posttussive chest discomfort.  That resolves shortly after coughing.  Cough is dry and nonproductive.  She reports she experiences a "tickle to the back of her throat" causing her cough.  Denies headache, dizziness, body aches, and wheezing/noisy breathing.  She has been using Mucinex as well as other over-the-counter medications to help with symptoms prior to arrival urgent care without much relief.   Cough   Past Medical History:  Diagnosis Date   Alopecia    Anxiety    Baker's cyst of knee    GERD (gastroesophageal reflux disease)    Hypertension    Prediabetes    Sleep apnea     Patient Active Problem List   Diagnosis Date Noted   Acute hypoxemic respiratory failure ([place]) 06/03/2019   Class 3 obesity ([place]) 06/03/2019   Prediabetes 06/03/2019   Pneumonia due to COVID-19 virus 05/30/2019   Hyponatremia 05/30/2019   AKI (acute kidney injury) ([place]) 05/30/2019   Fever 05/30/2019    Past Surgical History:  Procedure Laterality Date   APPENDECTOMY     HEMORRHOID SURGERY     KNEE SURGERY      OB History   No obstetric  history on file.      Home Medications    Prior to Admission medications   Medication Sig Start Date End Date Taking? Authorizing Provider  benzonatate (TESSALON) 100 MG capsule Take 1 capsule (100 mg total) by mouth every 8 (eight) hours. 02/10/22  Yes Talbot Grumbling, FNP  promethazine-dextromethorphan (PROMETHAZINE-DM) 6.25-15 MG/5ML syrup Take 5 mLs by mouth at bedtime as needed for cough. 02/10/22  Yes Talbot Grumbling, FNP  acetaminophen (TYLENOL) 325 MG tablet Take 650 mg by mouth every 6 (six) hours as needed for mild pain, fever or headache.    [provider]  albuterol (VENTOLIN HFA) 108 (90 Base) MCG/ACT inhaler Inhale 4 puffs into the lungs every 4 (four) hours as needed for wheezing or shortness of breath. 06/03/19   Arrien, Jimmy Picket, MD  Ascorbic Acid (VITAMIN C) 1000 MG tablet Take 1,000 mg by mouth daily.    [provider]  chlorthalidone (HYGROTON) 25 MG tablet Take 1 tablet (25 mg total) by mouth daily. 10/19/20   Valarie Merino, MD  fluticasone (FLONASE) 50 MCG/ACT nasal spray Place 1 spray into both nostrils daily as needed for allergies or rhinitis.  05/16/19   [provider]  guaiFENesin-dextromethorphan (ROBITUSSIN DM) 100-10 MG/5ML syrup Take 5 mLs by mouth every 6 (six) hours as needed for cough. 06/03/19   Arrien, Jimmy Picket, MD  losartan (COZAAR) 25 MG tablet Take  25 mg by mouth daily. Patient not taking: No sig reported 10/14/20   [provider]  Multiple Vitamin (MULTIVITAMIN) capsule Take 1 capsule by mouth daily.    [provider]  pantoprazole (PROTONIX) 40 MG tablet Take 1 tablet (40 mg total) by mouth daily. 06/04/19 10/19/20  Arrien, York Ram, MD  VITAMIN D PO Take 1 tablet by mouth daily.     [provider]    Family History Family History  Problem Relation Age of Onset   Hypertension Mother    Diabetes Mother    Hypercholesterolemia Mother    Cancer Father     Hypertension Sister    Diabetes Sister     Social History Social History   Tobacco Use   Smoking status: Former   Smokeless tobacco: Never  Building services engineer Use: Never used  Substance Use Topics   Alcohol use: Yes    Comment: occasionally   Drug use: Never     Allergies   Oxycodone-acetaminophen, Tyloxapol, and Erythromycin   Review of Systems Review of Systems  Respiratory:  Positive for cough.   Per HPI   Physical Exam Triage Vital Signs ED Triage Vitals  Enc Vitals Group     BP 02/10/22 1914 (!) 139/90     Pulse Rate 02/10/22 1914 77     Resp 02/10/22 1914 18     Temp 02/10/22 1914 97.9 F (36.6 C)     Temp Source 02/10/22 1914 Oral     SpO2 02/10/22 1914 98 %     Weight --      Height --      Head Circumference --      Peak Flow --      Pain Score 02/10/22 1911 0     Pain Loc --      Pain Edu? --      Excl. in GC? --    No data found.  Updated Vital Signs BP (!) 139/90 (BP Location: Right Arm)   Pulse 77   Temp 97.9 F (36.6 C) (Oral)   Resp 18   SpO2 98%   Visual Acuity Right Eye Distance:   Left Eye Distance:   Bilateral Distance:    Right Eye Near:   Left Eye Near:    Bilateral Near:     Physical Exam Vitals and nursing note reviewed.  Constitutional:      Appearance: She is not ill-appearing or toxic-appearing.  HENT:     Head: Normocephalic and atraumatic.     Right Ear: Hearing, tympanic membrane, ear canal and external ear normal.     Left Ear: Hearing, tympanic membrane, ear canal and external ear normal.     Nose: Congestion present.     Mouth/Throat:     Lips: Pink.     Mouth: Mucous membranes are moist.     Pharynx: No posterior oropharyngeal erythema.     Comments: Small amount of clear postnasal drainage visualized to the posterior oropharynx.  Eyes:     General: Lids are normal. Vision grossly intact. Gaze aligned appropriately.        Right eye: No discharge.        Left eye: No discharge.     Extraocular  Movements: Extraocular movements intact.     Conjunctiva/sclera: Conjunctivae normal.     Pupils: Pupils are equal, round, and reactive to light.  Cardiovascular:     Rate and Rhythm: Normal rate and regular rhythm.     Heart  sounds: Normal heart sounds, S1 normal and S2 normal.  Pulmonary:     Effort: Pulmonary effort is normal. No respiratory distress.     Breath sounds: Normal breath sounds and air entry. No stridor. No wheezing, rhonchi or rales.  Chest:     Chest wall: No tenderness.  Abdominal:     General: Abdomen is flat.     Palpations: Abdomen is soft.  Musculoskeletal:     Cervical back: Neck supple.     Right lower leg: No edema.     Left lower leg: No edema.  Lymphadenopathy:     Cervical: No cervical adenopathy.  Skin:    General: Skin is warm and dry.     Capillary Refill: Capillary refill takes less than 2 seconds.     Findings: No rash.  Neurological:     General: No focal deficit present.     Mental Status: She is alert and oriented to person, place, and time. Mental status is at baseline.     Cranial Nerves: No dysarthria or facial asymmetry.  Psychiatric:        Mood and Affect: Mood normal.        Speech: Speech normal.        Behavior: Behavior normal.        Thought Content: Thought content normal.        Judgment: Judgment normal.      UC Treatments / Results  Labs (all labs ordered are listed, but only abnormal results are displayed) Labs Reviewed - No data to display  EKG   Radiology No results found.  Procedures Procedures (including critical care time)  Medications Ordered in UC Medications - No data to display  Initial Impression / Assessment and Plan / UC Course  I have reviewed the triage vital signs and the nursing notes.  Pertinent labs & imaging results that were available during my care of the patient were reviewed by me and considered in my medical decision making (see chart for details).   1. Viral URI with  cough Symptoms and physical exam consistent with a viral upper respiratory tract infection that will likely resolve with rest, fluids, and prescriptions for symptomatic relief. Deferred imaging based on stable cardiopulmonary exam and hemodynamically stable vital signs.  Deferred viral testing based on timing of illness.  Tessalon Perles and Promethazine DM sent to pharmacy for symptomatic relief to be taken as prescribed.  May continue taking over the counter medications as directed for further symptomatic relief.  Drowsiness precautions discussed regarding promethazine DM prescription.  Nonpharmacologic interventions for symptom relief provided and after visit summary below. Advised to push fluids to stay well hydrated while recovering from viral illness.   Discussed physical exam and available lab work findings in clinic with patient.  Counseled patient regarding appropriate use of medications and potential side effects for all medications recommended or prescribed today. Discussed red flag signs and symptoms of worsening condition,when to call the PCP office, return to urgent care, and when to seek higher level of care in the emergency department. Patient verbalizes understanding and agreement with plan. All questions answered. Patient discharged in stable condition.     Final Clinical Impressions(s) / UC Diagnoses   Final diagnoses:  Viral URI with cough     Discharge Instructions      You have a viral upper respiratory infection.   Use the following medicines to help with symptoms: - Plain Mucinex (guaifenesin) over the counter as directed every 12 hours to  thin mucous so that you are able to get it out of your body easier. Drink plenty of water while taking this medication so that it works well in your body (at least 8 cups a day).  - Tylenol 1,000mg  and/or ibuprofen 600mg  every 6 hours with food as needed for aches/pains or fever/chills.  - Tessalon perles every 8 hours as needed for  cough. - Take Promethazine DM cough medication to help with your cough at nighttime so that you are able to sleep. Do not drive, drink alcohol, or go to work while taking this medication since it can make you sleepy. Only take this at nighttime.   1 tablespoon of honey in warm water and/or salt water gargles may also help with symptoms. Humidifier to your room will help add water to the air and reduce coughing.  If you develop any new or worsening symptoms, please return.  If your symptoms are severe, please go to the emergency room.  Follow-up with your primary care provider for further evaluation and management of your symptoms as well as ongoing wellness visits.  I hope you feel better!      ED Prescriptions     Medication Sig Dispense Auth. Provider   benzonatate (TESSALON) 100 MG capsule Take 1 capsule (100 mg total) by mouth every 8 (eight) hours. 21 capsule , FNP   promethazine-dextromethorphan (PROMETHAZINE-DM) 6.25-15 MG/5ML syrup Take 5 mLs by mouth at bedtime as needed for cough. 118 mL 03-17-1990, FNP      PDMP not reviewed this encounter.   Carlisle Beers, Carlisle Beers 02/10/22 1953

## 2023-04-29 IMAGING — MR MR HEAD W/O CM
14 of 16 series · 36 of 48 positions shown · non-contrast
Comparison: Head CT from 10/18/2020.

CLINICAL DATA: Initial evaluation for neuro deficit, stroke
suspected.

EXAM:
MRI HEAD WITHOUT CONTRAST
TECHNIQUE: Multiplanar, multiecho pulse sequences of the brain and surrounding
structures were obtained without intravenous contrast.

[Series 5: DWI · axial · 3.0mm · 0.96mm/px · z∈[-121,+36]mm · 5 of 108 slices shown (1 of 4)]
[im 1/108]
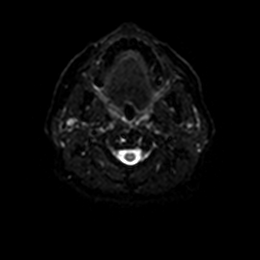
[im 27/108]
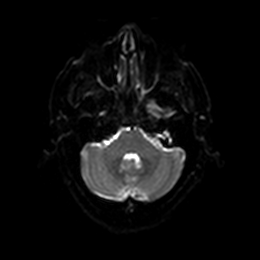
[im 54/108]
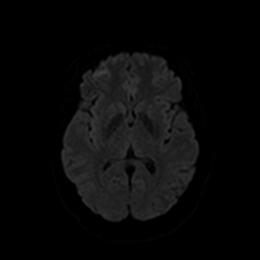
[im 81/108]
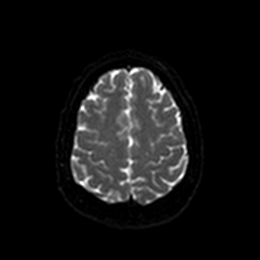
[im 108/108]
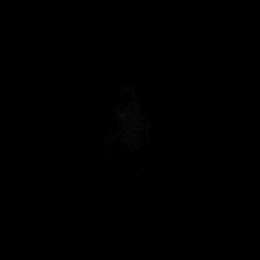

[Series 6: DWI · axial · 3.0mm · 0.96mm/px · z∈[-121,+36]mm · 2 of 54 slices shown (2 of 4)]
[im 1/54]
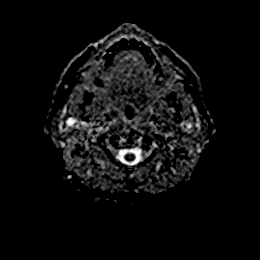
[im 54/54]
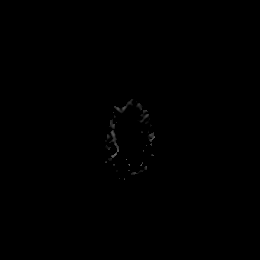

[Series 7: DWI · coronal · 4.0mm · 0.88mm/px · 3 of 76 slices shown (3 of 4)]
[im 1/76]
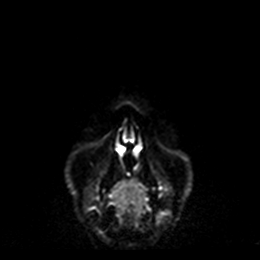
[im 38/76]
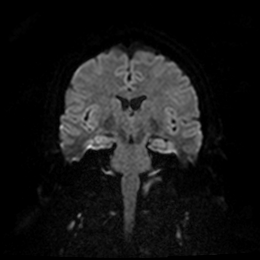
[im 76/76]
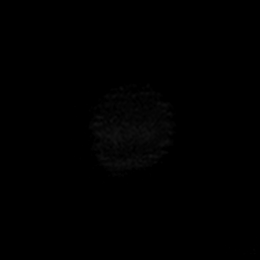

[Series 8: DWI · coronal · 4.0mm · 0.88mm/px · 2 of 38 slices shown (4 of 4)]
[im 1/38]
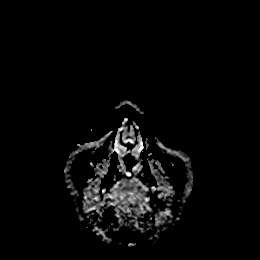
[im 38/38]
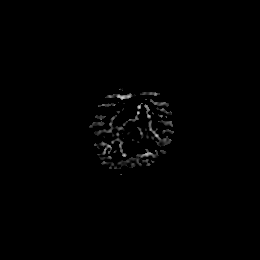

[Series 9: T1 · sagittal · 5.0mm · 0.75mm/px · 1 of 23 slices shown]
[im 1/23]
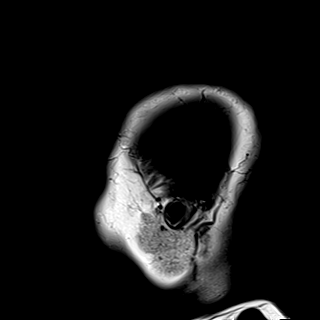

[Series 10: T2 · axial · 5.0mm · 0.75mm/px · 1 of 25 slices shown (1 of 2)]
[im 1/25]
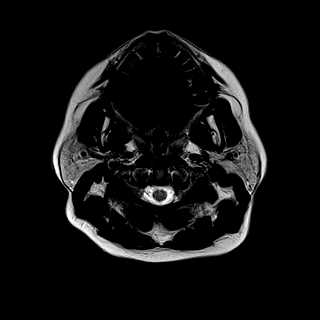

[Series 11: FLAIR · axial · 5.0mm · 0.45mm/px · 1 of 25 slices shown]
[im 1/25]
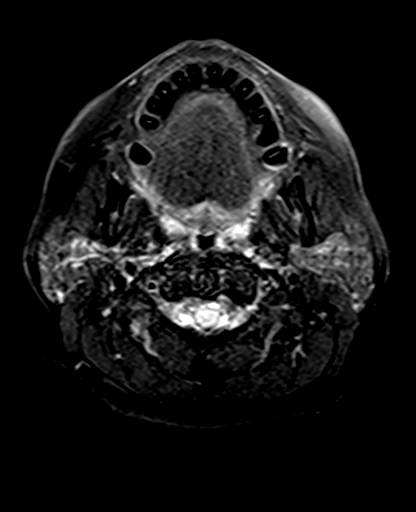

[Series 12: mag_images · axial · 3.0mm · 0.94mm/px · z∈[-125,+49]mm · 3 of 60 slices shown]
[im 1/60]
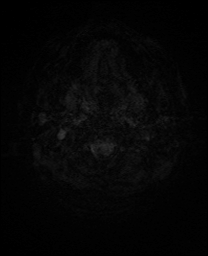
[im 30/60]
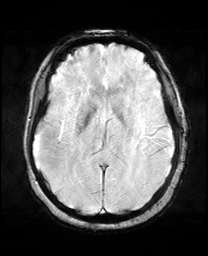
[im 60/60]
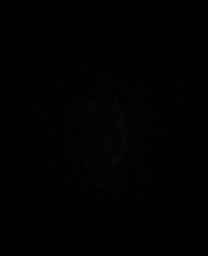

[Series 13: pha_images · axial · 3.0mm · 0.94mm/px · z∈[-125,+46]mm · 3 of 59 slices shown]
[im 1/59]
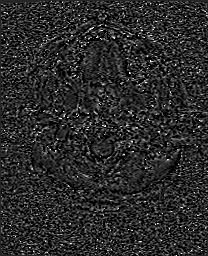
[im 30/59]
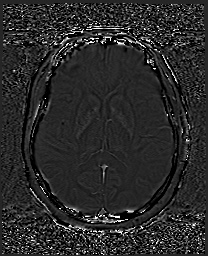
[im 59/59]
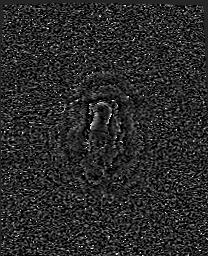

[Series 14: swi_images · axial · 3.0mm · 0.94mm/px · z∈[-125,+49]mm · 3 of 60 slices shown]
[im 1/60]
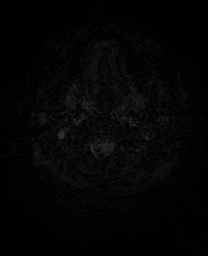
[im 30/60]
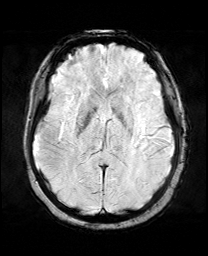
[im 60/60]
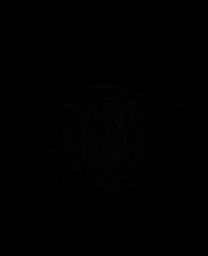

[Series 15: mip_images(sw) · axial · 24.0mm · 0.94mm/px · z∈[-115,+39]mm · 2 of 53 slices shown]
[im 1/53]
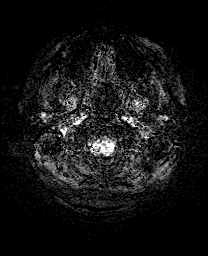
[im 53/53]
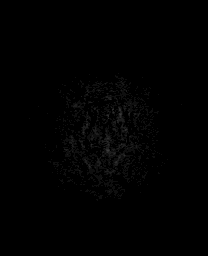

[Series 17: T2 · coronal · 5.0mm · 0.34mm/px · 1 of 29 slices shown (2 of 2)]
[im 1/29]
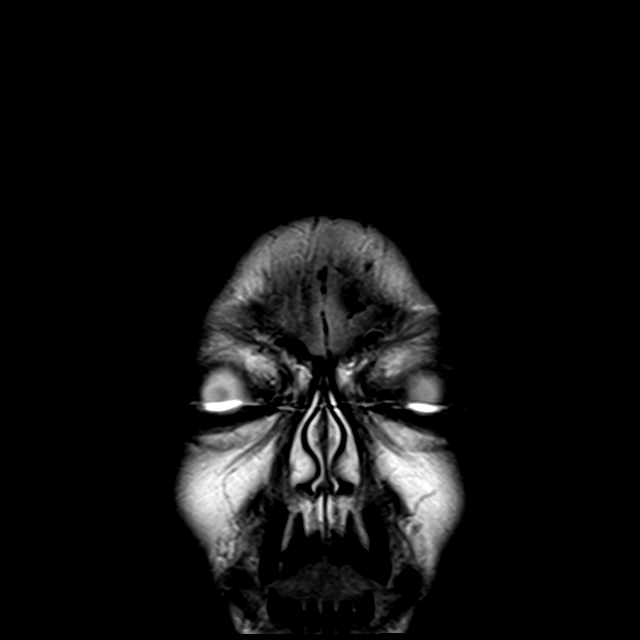

[Series 18: t2_space_dark-fluid_sag_p2_ns-ir · sagittal · 1.0mm · 0.49mm/px · 6 of 144 slices shown]
[im 1/144]
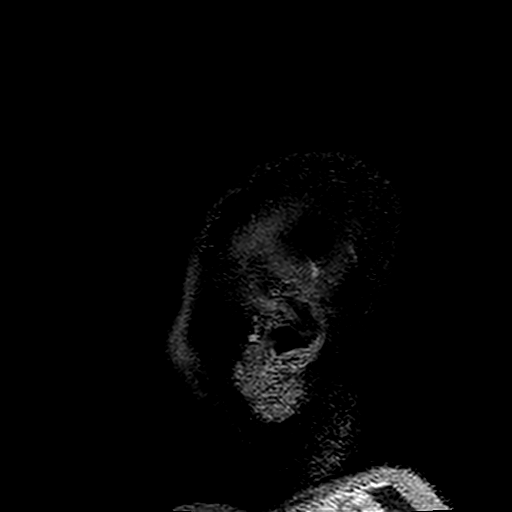
[im 29/144]
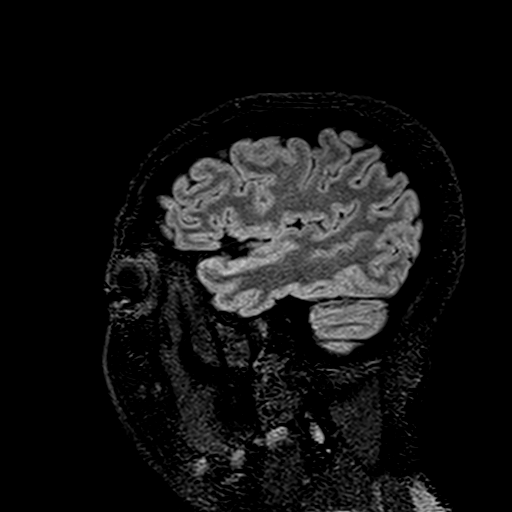
[im 58/144]
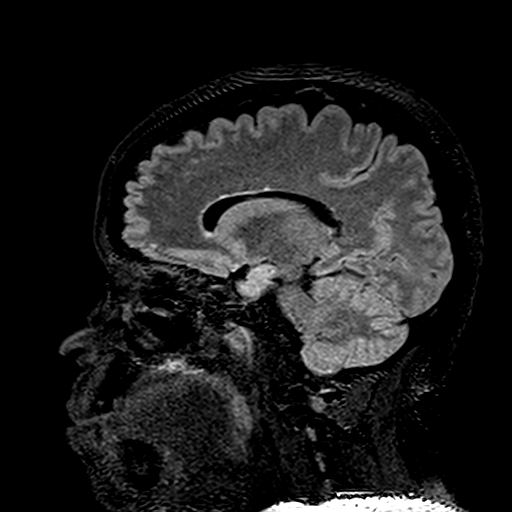
[im 86/144]
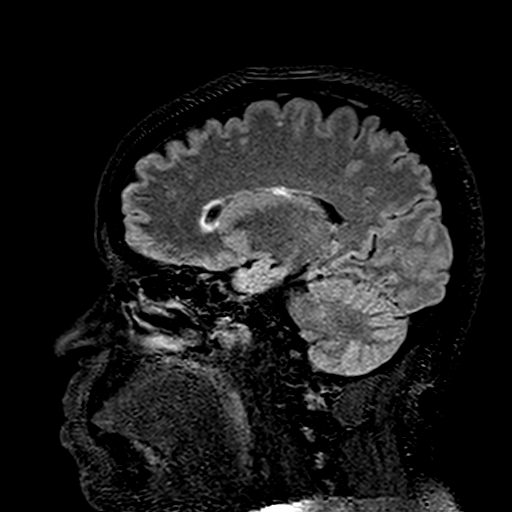
[im 115/144]
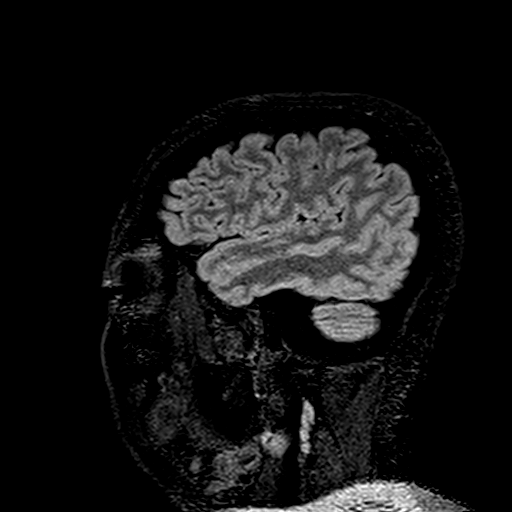
[im 144/144]
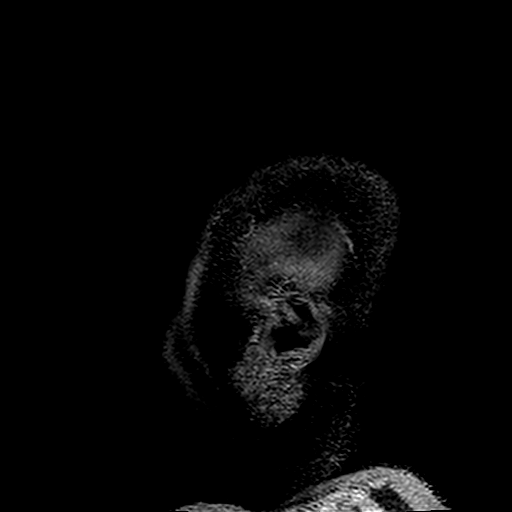

[Series 19: t2_space_dark-fluid_sag_p2_ns-ir_mpr_ axial · axial · 1.0mm · 0.45mm/px · z∈[-122,-64]mm · 3 of 150 slices shown]
[im 1/150]
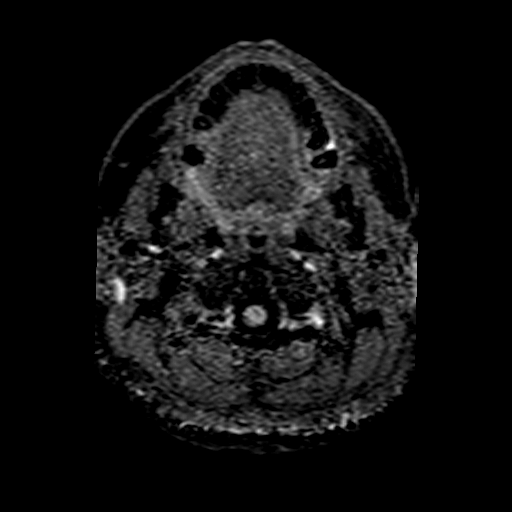
[im 30/150]
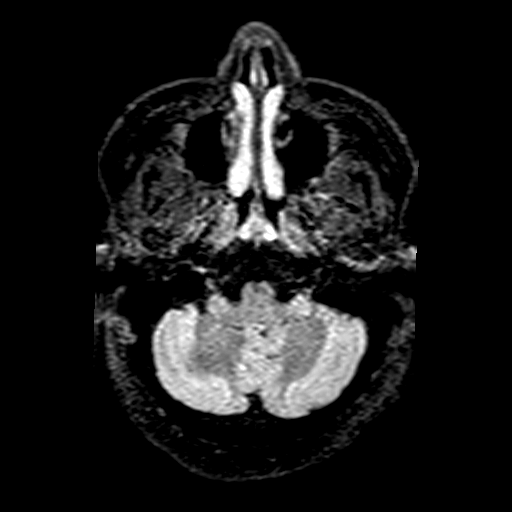
[im 60/150]
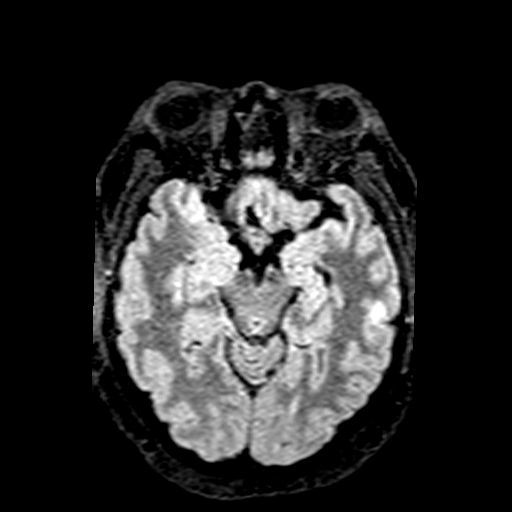

[36 of 48 positions shown; findings below may reference images not displayed]

FINDINGS: Brain: Cerebral volume within normal limits for patient age. Few
scattered subcentimeter foci of T2/FLAIR hyperintensity noted
involving the periventricular, deep, and subcortical white matter
both cerebral hemispheres, nonspecific, but overall mild in nature.

No abnormal foci of restricted diffusion to suggest acute or
subacute ischemia. Gray-white matter differentiation well
maintained. No encephalomalacia to suggest chronic infarction. No
foci of susceptibility artifact to suggest acute or chronic
intracranial hemorrhage.

No mass lesion, midline shift or mass effect. No hydrocephalus. No
extra-axial fluid collection.

Pituitary gland and suprasellar region are normal. Midline
structures intact and normal.

Vascular: Major intracranial vascular flow voids well maintained and
normal in appearance.

Skull and upper cervical spine: Craniocervical junction normal.
Visualized upper cervical spine within normal limits. Bone marrow
signal intensity normal. No scalp soft tissue abnormality.

Sinuses/Orbits: Globes and orbital soft tissues within normal
limits.

Paranasal sinuses are largely clear. No significant mastoid
effusion. Inner ear structures grossly normal.

Other: None.
IMPRESSION: 1. No acute intracranial abnormality.
2. Mild T2/FLAIR hyperintensity involving the supratentorial
cerebral white matter, nonspecific, but most commonly related to
chronic microvascular ischemic disease. Overall, changes are mild
for age.
